# Patient Record
Sex: Female | Born: 1990 | Race: Black or African American | Hispanic: No | Marital: Married | Smoking: Never smoker
Health system: Southern US, Community
[De-identification: ages and names within clinical notes are randomized; demographics above are authoritative.]

## PROBLEM LIST (undated history)

## (undated) ENCOUNTER — Inpatient Hospital Stay (HOSPITAL_COMMUNITY): Payer: Self-pay

## (undated) DIAGNOSIS — Z789 Other specified health status: Secondary | ICD-10-CM

## (undated) HISTORY — PX: OTHER SURGICAL HISTORY: SHX169

---
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
## 2015-06-17 NOTE — L&D Delivery Note (Signed)
Delivery Note At 8:10 PM a viable female was delivered via  (Presentation:vertex ; LOA ).  APGAR: 8,9 ; weight  .   Placenta status:spont ,shultz.  Cord 3vc  with the following complications: none.  Cord pH: n/a  Anesthesia:  epidural Episiotomy: none  Lacerations:  none Suture Repair: n/a Est. Blood Loss 100(mL):    Mom to postpartum.  Baby to Couplet care / Skin to Skin.  Leah Morris 02/17/2016, 8:21 PM

## 2015-09-14 ENCOUNTER — Emergency Department (HOSPITAL_COMMUNITY): Payer: Medicaid Other

## 2015-09-14 ENCOUNTER — Encounter (HOSPITAL_COMMUNITY): Payer: Self-pay | Admitting: Family Medicine

## 2015-09-14 ENCOUNTER — Emergency Department (HOSPITAL_COMMUNITY)
Admission: EM | Admit: 2015-09-14 | Discharge: 2015-09-14 | Disposition: A | Discharge status: Home / Self Care | Payer: Medicaid Other | Attending: Emergency Medicine | Admitting: Emergency Medicine

## 2015-09-14 DIAGNOSIS — O23592 Infection of other part of genital tract in pregnancy, second trimester: Secondary | ICD-10-CM | POA: Diagnosis not present

## 2015-09-14 DIAGNOSIS — O209 Hemorrhage in early pregnancy, unspecified: Secondary | ICD-10-CM | POA: Diagnosis present

## 2015-09-14 DIAGNOSIS — Z3A15 15 weeks gestation of pregnancy: Secondary | ICD-10-CM | POA: Insufficient documentation

## 2015-09-14 DIAGNOSIS — B9689 Other specified bacterial agents as the cause of diseases classified elsewhere: Secondary | ICD-10-CM

## 2015-09-14 DIAGNOSIS — O200 Threatened abortion: Secondary | ICD-10-CM

## 2015-09-14 DIAGNOSIS — N939 Abnormal uterine and vaginal bleeding, unspecified: Secondary | ICD-10-CM

## 2015-09-14 DIAGNOSIS — N760 Acute vaginitis: Secondary | ICD-10-CM

## 2015-09-14 LAB — ABO/RH: ABO/RH(D): O POS

## 2015-09-14 LAB — WET PREP, GENITAL
Sperm: NONE SEEN
TRICH WET PREP: NONE SEEN
YEAST WET PREP: NONE SEEN

## 2015-09-14 LAB — I-STAT BETA HCG BLOOD, ED (MC, WL, AP ONLY): I-stat hCG, quantitative: >2000 m[IU]/mL — ABNORMAL HIGH (ref <5)

## 2015-09-14 LAB — RAPID HIV SCREEN (HIV 1/2 AB+AG)
HIV 1/2 Antibodies: NONREACTIVE
HIV-1 P24 ANTIGEN - HIV24: NONREACTIVE

## 2015-09-14 MED ORDER — METRONIDAZOLE 500 MG PO TABS: 500.0000 mg | ORAL_TABLET | Freq: Two times a day (BID) | ORAL | Status: DC

## 2015-09-14 NOTE — ED Notes (Signed)
Pt here sts she is [redacted] weeks pregnant and started bleeding today. sts she felt something come out. Denies pain. sts for the past few days before this she was spotting.

## 2015-09-14 NOTE — ED Provider Notes (Signed)
CSN: [id number]     Arrival date & time 09/14/15  1538 History  By signing my name below, I, Placido Sou, attest that this documentation has been prepared under the direction and in the presence of Fayrene Helper, PA-C. Electronically Signed: Placido Sou, ED Scribe. 09/14/2015. 4:56 PM.   Chief Complaint  Patient presents with  . Miscarriage   The history is provided by the patient. No language interpreter was used.    HPI Comments: Leah Morris is a 25 y.o. female who is [redacted] weeks pregnant and otherwise healthy presents to the Emergency Department complaining of worsening, mild, vaginal bleeding onset 3 days ago. Pt states she had an appointment 2 weeks ago where she confirmed being pregnant further noting that she was spotting at the time. She receieved an [place] during this visit which showed no abnormalities as well as another [place] 3 days ago with no abnormalities. Pt states she began spotting a few days ago that worsened to large amounts of vaginal bleeding s/p urinating earlier today. Her LNMP was in 03/2015. She reports a hx of 5 pregnancies, 1 miscarriage, 2 elective abortions at ~8 weeks, 1 molar pregnancy and 1 normal childbirth. She denies abd pain, recent trauma, lightheadedness or any other associated symptoms at this time.    History reviewed. No pertinent past medical history. History reviewed. No pertinent past surgical history. History reviewed. No pertinent family history. Social History  Substance Use Topics  . Smoking status: Never Smoker   . Smokeless tobacco: None  . Alcohol Use: No   OB History    Gravida Para Term Preterm AB TAB SAB Ectopic Multiple Living   1              Review of Systems  Gastrointestinal: Negative for abdominal pain.  Genitourinary: Positive for vaginal bleeding.  Neurological: Negative for light-headedness.  All other systems reviewed and are negative.  Allergies  Review of patient's allergies indicates no known allergies.  Home  Medications   Prior to Admission medications   Not on File   BP 119/62 mmHg  Pulse 108  Temp(Src) 98.6 F (37 C) (Oral)  Resp 12  Ht  (1.626 m)  Wt 164 lb 3 oz (74.475 kg)  BMI 28.17 kg/m2  SpO2 100%  LMP 04/13/2015 Physical Exam  Constitutional: She is oriented to person, place, and time. She appears well-developed and well-nourished.  HENT:  Head: Normocephalic and atraumatic.  Eyes: EOM are normal.  Neck: Normal range of motion.  Cardiovascular: Normal rate, regular rhythm and normal heart sounds.  Exam reveals no gallop and no friction rub.   No murmur heard. Pulmonary/Chest: Effort normal and breath sounds normal. No respiratory distress. She has no wheezes. She has no rales.  Abdominal: Soft. There is no tenderness. There is no CVA tenderness.  Gravid abd non-tender to palpation  Genitourinary:  Chaperone present during exam. No inguinal lymphadenopathy or inguinal hernia noted. Normal external genitalia. No significant discomfort with speculum insertion. Moderate amount of blood and products of conception noted in vaginal vault. Cervical os is closed. On bimanual examination, no adnexal tenderness or cervical motion tenderness.  Musculoskeletal: Normal range of motion.  Neurological: She is alert and oriented to person, place, and time.  Skin: Skin is warm and dry.  Psychiatric: She has a normal mood and affect.  Nursing note and vitals reviewed.  ED Course  Procedures  DIAGNOSTIC STUDIES: Oxygen Saturation is 100% on RA, normal by my interpretation.    COORDINATION OF  CARE: 4:52 PM Pt presents today due to vaginal bleeding. Discussed next steps with pt including a pelvic exam and lab work. Pt agreed to plan.  Labs Review Labs Reviewed  WET PREP, GENITAL - Abnormal; Notable for the following:    Clue Cells Wet Prep HPF POC PRESENT (*)    WBC, Wet Prep HPF POC MANY (*)    All other components within normal limits  I-STAT BETA HCG BLOOD, ED ([facility], [facility], [facility] ONLY)  - Abnormal; Notable for the following:    I-stat hCG, quantitative >2000.0 (*)    All other components within normal limits  RAPID HIV SCREEN (HIV 1/2 AB+AG)  RPR  ABO/RH  GC/CHLAMYDIA PROBE AMP ([place]) NOT AT [facility]    Imaging Review Koreas Ob Limited  09/14/2015  CLINICAL DATA:  Vaginal bleeding 2 hours. EXAM: LIMITED OBSTETRIC ULTRASOUND FINDINGS: Number of Fetuses:  Single Heart Rate:  155 bpm Movement: Yes Presentation: Transverse with head to maternal right side. Placental Location: Anterior Previa: No Amniotic Fluid (Subjective):  Within normal limits. BPD:  3.2cm 16w  0d MATERNAL FINDINGS: Cervix:  Appears closed. Uterus/Adnexae:  No abnormality visualized. IMPRESSION: Single live IUP with estimated gestational age [redacted] weeks 0 days. This exam is performed on an emergent basis and does not comprehensively evaluate fetal size, dating, or anatomy; follow-up complete OB US should be considered if further fetal assessment is warranted. Electronically Signed   By: Elberta Fortisaniel  Boyle M.D.   On: 09/14/2015 18:21   I have personally reviewed and evaluated these images and lab results as part of my medical decision-making.   EKG Interpretation None      MDM   Final diagnoses:  Threatened miscarriage  Bacterial vaginosis    BP 119/62 mmHg  Pulse 108  Temp(Src) 98.6 F (37 C) (Oral)  Resp 12  Ht 5\' 4"  (1.626 m)  Wt 74.475 kg  BMI 28.17 kg/m2  SpO2 100%  LMP 04/13/2015   I personally performed the services described in this documentation, which was scribed in my presence. The recorded information has been reviewed and is accurate.     5:23 PM Patient who is [redacted] weeks pregnant with recent full ultrasound indicating that it was an intrauterine pregnancy who is here with vaginal bleeding and presence of products of conception on pelvic exam concerning for incomplete abortion. Work up initiated, will repeat US.  Care discussed with Dr. Anitra LauthPlunkett  6:35 PM Wet prep shows evidence of clue  cells and many WBC. Her HIV test is nonreactive. She is O+. Her quantitative hCG is greater than 2000. A limited pelvic transvaginal ultrasound demonstrates a single live IUP with estimated gestational age of [redacted] weeks. It is recommended that patient will need a complete ultrasound within a week. I will provide treatment for bacterial vaginosis. Encouraged patient to continue with prenatal vitamins and avoid alcohol or tobacco use. [facility] Return precaution discussed. Patient voiced understanding and agrees with plan. Patient will be discharged with a diagnosis of threatened miscarriage.   Fayrene HelperBowie Vergia Chea, PA-C 09/14/15 1844  Gwyneth SproutWhitney Plunkett, MD 09/15/15 [phone]

## 2015-09-14 NOTE — Discharge Instructions (Signed)
Please follow up with [facility] next week for reevaluation of your vaginal bleeding.  Take metronidazole as treatment for your bacterial vaginosis.    Threatened Miscarriage A threatened miscarriage occurs when you have vaginal bleeding during your first 20 weeks of pregnancy but the pregnancy has not ended. If you have vaginal bleeding during this time, your health care provider will do tests to make sure you are still pregnant. If the tests show you are still pregnant and the developing baby (fetus) inside your womb (uterus) is still growing, your condition is considered a threatened miscarriage. A threatened miscarriage does not mean your pregnancy will end, but it does increase the risk of losing your pregnancy (complete miscarriage). CAUSES  The cause of a threatened miscarriage is usually not known. If you go on to have a complete miscarriage, the most common cause is an abnormal number of chromosomes in the developing baby. Chromosomes are the structures inside cells that hold all your genetic material. Some causes of vaginal bleeding that do not result in miscarriage include:  Having sex.  Having an infection.  Normal hormone changes of pregnancy.  Bleeding that occurs when an egg implants in your uterus. RISK FACTORS Risk factors for bleeding in early pregnancy include:  Obesity.  Smoking.  Drinking excessive amounts of alcohol or caffeine.  Recreational drug use. SIGNS AND SYMPTOMS  Light vaginal bleeding.  Mild abdominal pain or cramps. DIAGNOSIS  If you have bleeding with or without abdominal pain before 20 weeks of pregnancy, your health care provider will do tests to check whether you are still pregnant. One important test involves using sound waves and a computer (ultrasound) to create images of the inside of your uterus. Other tests include an internal exam of your vagina and uterus (pelvic exam) and measurement of your baby's heart rate.  You may be diagnosed  with a threatened miscarriage if:  Ultrasound testing shows you are still pregnant.  Your baby's heart rate is strong.  A pelvic exam shows that the opening between your uterus and your vagina (cervix) is closed.  Your heart rate and blood pressure are stable.  Blood tests confirm you are still pregnant. TREATMENT  No treatments have been shown to prevent a threatened miscarriage from going on to a complete miscarriage. However, the right home care is important.  HOME CARE INSTRUCTIONS   Make sure you keep all your appointments for prenatal care. This is very important.  Get plenty of rest.  Do not have sex or use tampons if you have vaginal bleeding.  Do not douche.  Do not smoke or use recreational drugs.  Do not drink alcohol.  Avoid caffeine. SEEK MEDICAL CARE IF:  You have light vaginal bleeding or spotting while pregnant.  You have abdominal pain or cramping.  You have a fever. SEEK IMMEDIATE MEDICAL CARE IF:  You have heavy vaginal bleeding.  You have blood clots coming from your vagina.  You have severe low back pain or abdominal cramps.  You have fever, chills, and severe abdominal pain. MAKE SURE YOU:  Understand these instructions.  Will watch your condition.  Will get help right away if you are not doing well or get worse.   This information is not intended to replace advice given to you by your health care provider. Make sure you discuss any questions you have with your health care provider.   Document Released: 06/02/2005 Document Revised: 06/07/2013 Document Reviewed: 03/29/2013 Elsevier Interactive Patient Education [employer]2016 Elsevier Inc.  Bacterial  Vaginosis Bacterial vaginosis is an infection of the vagina. It happens when too many germs (bacteria) grow in the vagina. Having this infection puts you at risk for getting other infections from sex. Treating this infection can help lower your risk for other infections, such as:    Chlamydia.  Gonorrhea.  HIV.  Herpes. HOME CARE  Take your medicine as told by your doctor.  Finish your medicine even if you start to feel better.  Tell your sex partner that you have an infection. They should see their doctor for treatment.  During treatment:  Avoid sex or use condoms correctly.  Do not douche.  Do not drink alcohol unless your doctor tells you it is ok.  Do not breastfeed unless your doctor tells you it is ok. GET HELP IF:  You are not getting better after 3 days of treatment.  You have more grey fluid (discharge) coming from your vagina than before.  You have more pain than before.  You have a fever. MAKE SURE YOU:   Understand these instructions.  Will watch your condition.  Will get help right away if you are not doing well or get worse.   This information is not intended to replace advice given to you by your health care provider. Make sure you discuss any questions you have with your health care provider.   Document Released: 03/11/2008 Document Revised: 06/23/2014 Document Reviewed: 01/12/2013 Elsevier Interactive Patient Education [employer]

## 2015-09-15 LAB — RPR: RPR Ser Ql: NONREACTIVE

## 2015-09-17 ENCOUNTER — Telehealth: Payer: Self-pay | Admitting: Family Medicine

## 2015-09-17 LAB — GC/CHLAMYDIA PROBE AMP (~~LOC~~) NOT AT ARMC
Chlamydia: NEGATIVE
Neisseria Gonorrhea: NEGATIVE

## 2015-09-17 NOTE — Telephone Encounter (Signed)
Patient called into clinic, due to continued bleeding and miscarriage concerns. Talked to Dr. Alvester MorinNewton who [facility] and to follow up after in clinic.

## 2015-09-21 ENCOUNTER — Ambulatory Visit: Payer: Self-pay | Admitting: Obstetrics and Gynecology

## 2015-09-25 NOTE — Telephone Encounter (Signed)
According to chart patient has upcoming appt on 10/04/2015 will discuss at that office visit.

## 2015-10-04 ENCOUNTER — Encounter: Payer: Self-pay | Admitting: Obstetrics & Gynecology

## 2015-10-04 ENCOUNTER — Ambulatory Visit (INDEPENDENT_AMBULATORY_CARE_PROVIDER_SITE_OTHER): Payer: Self-pay | Admitting: Obstetrics & Gynecology

## 2015-10-04 VITALS — Wt 154.6 lb

## 2015-10-04 DIAGNOSIS — Z3490 Encounter for supervision of normal pregnancy, unspecified, unspecified trimester: Secondary | ICD-10-CM | POA: Insufficient documentation

## 2015-10-04 DIAGNOSIS — O4692 Antepartum hemorrhage, unspecified, second trimester: Secondary | ICD-10-CM

## 2015-10-04 DIAGNOSIS — Z3492 Encounter for supervision of normal pregnancy, unspecified, second trimester: Secondary | ICD-10-CM

## 2015-10-04 DIAGNOSIS — Z3689 Encounter for other specified antenatal screening: Secondary | ICD-10-CM

## 2015-10-04 DIAGNOSIS — Z124 Encounter for screening for malignant neoplasm of cervix: Secondary | ICD-10-CM

## 2015-10-04 LAB — POCT URINALYSIS DIP (DEVICE)
GLUCOSE, UA: NEGATIVE mg/dL
Ketones, ur: 40 mg/dL — AB
Leukocytes, UA: NEGATIVE
NITRITE: NEGATIVE
PROTEIN: 30 mg/dL — AB
Specific Gravity, Urine: 1.025 (ref 1.005–1.030)
UROBILINOGEN UA: 4 mg/dL — AB (ref 0.0–1.0)
pH: 7 (ref 5.0–8.0)

## 2015-10-04 NOTE — Progress Notes (Signed)
   Subjective: vaginal bleeding for 3 weeks, now dark spotting    Leah Morris is a [id number] 7514w6d being seen today for her first obstetrical visit.  Her obstetrical history is significant for second trimester bleeding. Patient does intend to breast feed. Pregnancy history fully reviewed.  Patient reports dark blood.  Filed Vitals:   10/04/15 1422  Weight: 154 lb 9.6 oz (70.126 kg)    HISTORY: OB History  Gravida Para Term Preterm AB SAB TAB Ectopic Multiple Living  5 1 1  3 2 1   1     # Outcome Date GA Lbr Len/2nd Weight Sex Delivery Anes PTL Lv  5 Current           4 Term 07/03/14 527w5d  6 lb 7 oz (2.92 kg) F Vag-Spont EPI  Y  3 SAB 2010             Complications: Molar pregnancy  2 SAB 2008          1 TAB 2006             History reviewed. No pertinent past medical history. Past Surgical History  Procedure Laterality Date  . Dilation and curettage of uterus  2010   History reviewed. No pertinent family history.   Exam    Uterus:     Pelvic Exam:    Perineum: No Hemorrhoids   Vulva: normal   Vagina:  scant blood   pH:    Cervix: no lesions   Adnexa: not evaluated   Bony Pelvis: average  System: Breast:  normal appearance, no masses or tenderness   Skin: normal coloration and turgor, no rashes    Neurologic: oriented, normal mood   Extremities: normal strength, tone, and muscle mass   HEENT oropharynx clear, no lesions and thyroid without masses   Mouth/Teeth mucous membranes moist, pharynx normal without lesions and dental hygiene good   Neck supple   Cardiovascular: regular rate and rhythm   Respiratory:  appears well, vitals normal, no respiratory distress, acyanotic, normal RR   Abdomen: soft, non-tender; bowel sounds normal; no masses,  no organomegaly   Urinary: urethral meatus normal      Assessment:    Pregnancy: [id number] Patient Active Problem List   Diagnosis Date Noted  . Supervision of low-risk pregnancy 10/04/2015  . Second trimester  bleeding 10/04/2015        Plan:     Initial labs drawn. Prenatal vitamins. Problem list reviewed and updated. Genetic Screening discussed Quad Screen: next visit.  Ultrasound discussed; fetal survey: ordered.  Follow up in 2 weeks. 50% of 30 min visit spent on counseling and coordination of care.  Bleeding precautions   ARNOLD,JAMES 10/04/2015

## 2015-10-04 NOTE — Patient Instructions (Signed)

## 2015-10-04 NOTE — Progress Notes (Signed)
New ob packet given  Anatomy US scheduled for April 24th @ 1500.   Pt notified

## 2015-10-05 LAB — PRENATAL PROFILE (SOLSTAS)
ANTIBODY SCREEN: NEGATIVE
BASOS ABS: 0 {cells}/uL (ref 0–200)
BASOS PCT: 0 %
EOS PCT: 1 %
Eosinophils Absolute: 73 cells/uL (ref 15–500)
HCT: 35.3 % (ref 35.0–45.0)
HEMOGLOBIN: 11.8 g/dL (ref 11.7–15.5)
HEP B S AG: NEGATIVE
HIV 1&2 Ab, 4th Generation: NONREACTIVE
LYMPHS ABS: 1533 {cells}/uL (ref 850–3900)
Lymphocytes Relative: 21 %
MCH: 31.2 pg (ref 27.0–33.0)
MCHC: 33.4 g/dL (ref 32.0–36.0)
MCV: 93.4 fL (ref 80.0–100.0)
MONOS PCT: 12 %
MPV: 9.8 fL (ref 7.5–12.5)
Monocytes Absolute: 876 cells/uL (ref 200–950)
NEUTROS ABS: 4818 {cells}/uL (ref 1500–7800)
Neutrophils Relative %: 66 %
PLATELETS: 228 10*3/uL (ref 140–400)
RBC: 3.78 MIL/uL — AB (ref 3.80–5.10)
RDW: 14.7 % (ref 11.0–15.0)
RH TYPE: POSITIVE
RUBELLA: 3.55 {index} — AB (ref <0.90)
WBC: 7.3 10*3/uL (ref 3.8–10.8)

## 2015-10-08 ENCOUNTER — Other Ambulatory Visit: Payer: Self-pay | Admitting: Obstetrics & Gynecology

## 2015-10-08 ENCOUNTER — Ambulatory Visit (HOSPITAL_COMMUNITY)
Admission: RE | Admit: 2015-10-08 | Discharge: 2015-10-08 | Disposition: A | Discharge status: Home / Self Care | Payer: Medicaid Other | Source: Ambulatory Visit | Attending: Obstetrics & Gynecology | Admitting: Obstetrics & Gynecology

## 2015-10-08 DIAGNOSIS — O4692 Antepartum hemorrhage, unspecified, second trimester: Secondary | ICD-10-CM | POA: Diagnosis not present

## 2015-10-08 DIAGNOSIS — Z3A18 18 weeks gestation of pregnancy: Secondary | ICD-10-CM | POA: Insufficient documentation

## 2015-10-08 DIAGNOSIS — Z36 Encounter for antenatal screening of mother: Secondary | ICD-10-CM | POA: Diagnosis not present

## 2015-10-08 DIAGNOSIS — Z3A19 19 weeks gestation of pregnancy: Secondary | ICD-10-CM

## 2015-10-08 DIAGNOSIS — Z3492 Encounter for supervision of normal pregnancy, unspecified, second trimester: Secondary | ICD-10-CM

## 2015-10-08 DIAGNOSIS — Z3689 Encounter for other specified antenatal screening: Secondary | ICD-10-CM

## 2015-10-08 LAB — HEMOGLOBINOPATHY EVALUATION
HEMOGLOBIN OTHER: 0 %
HGB A: 97.2 % (ref 96.8–97.8)
HGB F QUANT: 0 % (ref 0.0–2.0)
HGB S QUANTITAION: 0 %
Hgb A2 Quant: 2.8 % (ref 2.2–3.2)

## 2015-10-08 LAB — AFP, QUAD SCREEN
AFP: 53.5 ng/mL
CURR GEST AGE: 18.9 wk
Down Syndrome Scr Risk Est: 1:38500 {titer}
HCG, Total: 9.96 IU/mL
INH: 166.7 pg/mL
Interpretation-AFP: NEGATIVE
MOM FOR AFP: 0.98
MOM FOR INH: 0.99
MoM for hCG: 0.39
OPEN SPINA BIFIDA: NEGATIVE
Osb Risk: 1:28200 {titer}
Tri 18 Scr Risk Est: NEGATIVE
Trisomy 18 (Edward) Syndrome Interp.: 1:3220 {titer}
UE3 MOM: 0.76
UE3 VALUE: 1.14 ng/mL

## 2015-10-09 ENCOUNTER — Other Ambulatory Visit (HOSPITAL_COMMUNITY): Payer: Self-pay | Admitting: *Deleted

## 2015-10-09 DIAGNOSIS — O418X90 Other specified disorders of amniotic fluid and membranes, unspecified trimester, not applicable or unspecified: Secondary | ICD-10-CM

## 2015-10-09 DIAGNOSIS — O4590 Premature separation of placenta, unspecified, unspecified trimester: Principal | ICD-10-CM

## 2015-10-18 ENCOUNTER — Encounter (HOSPITAL_COMMUNITY): Payer: Self-pay

## 2015-10-18 ENCOUNTER — Ambulatory Visit (HOSPITAL_COMMUNITY)
Admission: RE | Admit: 2015-10-18 | Discharge: 2015-10-18 | Disposition: A | Discharge status: Home / Self Care | Payer: Medicaid Other | Source: Ambulatory Visit | Attending: Obstetrics & Gynecology | Admitting: Obstetrics & Gynecology

## 2015-10-18 ENCOUNTER — Other Ambulatory Visit (HOSPITAL_COMMUNITY): Payer: Self-pay | Admitting: Maternal and Fetal Medicine

## 2015-10-18 VITALS — BP 116/56 | HR 79 | Wt 161.0 lb

## 2015-10-18 DIAGNOSIS — O4592 Premature separation of placenta, unspecified, second trimester: Secondary | ICD-10-CM | POA: Diagnosis not present

## 2015-10-18 DIAGNOSIS — O4590 Premature separation of placenta, unspecified, unspecified trimester: Secondary | ICD-10-CM

## 2015-10-18 DIAGNOSIS — O358XX0 Maternal care for other (suspected) fetal abnormality and damage, not applicable or unspecified: Secondary | ICD-10-CM | POA: Diagnosis present

## 2015-10-18 DIAGNOSIS — Z3A20 20 weeks gestation of pregnancy: Secondary | ICD-10-CM

## 2015-10-18 DIAGNOSIS — O359XX0 Maternal care for (suspected) fetal abnormality and damage, unspecified, not applicable or unspecified: Secondary | ICD-10-CM

## 2015-10-18 DIAGNOSIS — O418X90 Other specified disorders of amniotic fluid and membranes, unspecified trimester, not applicable or unspecified: Secondary | ICD-10-CM

## 2015-10-18 NOTE — ED Notes (Signed)
Pt continues to have small amt of brownish discharge.

## 2015-10-19 ENCOUNTER — Ambulatory Visit (INDEPENDENT_AMBULATORY_CARE_PROVIDER_SITE_OTHER): Payer: Medicaid Other | Admitting: Family Medicine

## 2015-10-19 VITALS — BP 118/63 | HR 76 | Wt 161.4 lb

## 2015-10-19 DIAGNOSIS — O9989 Other specified diseases and conditions complicating pregnancy, childbirth and the puerperium: Secondary | ICD-10-CM

## 2015-10-19 DIAGNOSIS — Z3492 Encounter for supervision of normal pregnancy, unspecified, second trimester: Secondary | ICD-10-CM

## 2015-10-19 DIAGNOSIS — N898 Other specified noninflammatory disorders of vagina: Secondary | ICD-10-CM

## 2015-10-19 LAB — POCT URINALYSIS DIP (DEVICE)
BILIRUBIN URINE: NEGATIVE
Glucose, UA: NEGATIVE mg/dL
KETONES UR: NEGATIVE mg/dL
Nitrite: NEGATIVE
PH: 7 (ref 5.0–8.0)
Protein, ur: 30 mg/dL — AB
Specific Gravity, Urine: 1.02 (ref 1.005–1.030)
Urobilinogen, UA: 1 mg/dL (ref 0.0–1.0)

## 2015-10-19 NOTE — Progress Notes (Signed)
Subjective:  Leah Morris is a 25 y.o. G5P1031 at 7542w1d being seen today for ongoing prenatal care.  She is currently monitored for the following issues for this low-risk pregnancy and has Supervision of low-risk pregnancy and Second trimester bleeding on her problem list.  Patient reports vaginal irritation.  Had BV, not treated.  Contractions: Not present. Vag. Bleeding: Scant.  Movement: Present. Denies leaking of fluid.   The following portions of the patient's history were reviewed and updated as appropriate: allergies, current medications, past family history, past medical history, past social history, past surgical history and problem list. Problem list updated.  Objective:   Filed Vitals:   10/19/15 1103  BP: 118/63  Pulse: 76  Weight: 161 lb 6.4 oz (73.211 kg)    Fetal Status: Fetal Heart Rate (bpm): 150   Movement: Present     General:  Alert, oriented and cooperative. Patient is in no acute distress.  Skin: Skin is warm and dry. No rash noted.   Cardiovascular: Normal heart rate noted  Respiratory: Normal respiratory effort, no problems with respiration noted  Abdomen: Soft, gravid, appropriate for gestational age. Pain/Pressure: Present     Pelvic: Vag. Bleeding: Scant     Cervical exam deferred        Extremities: Normal range of motion.  Edema: None  Mental Status: Normal mood and affect. Normal behavior. Normal judgment and thought content.   Urinalysis: Urine Protein: 1+ Urine Glucose: Negative  Assessment and Plan:  Pregnancy: G5P1031 at 9742w1d  1. Supervision of low-risk pregnancy, second trimester FHT, FH  2.  Vaginal irritation Wet prep today.  Preterm labor symptoms and general obstetric precautions including but not limited to vaginal bleeding, contractions, leaking of fluid and fetal movement were reviewed in detail with the patient. Please refer to After Visit Summary for other counseling recommendations.  No Follow-up on file.   Levie HeritageJacob J Stinson,  DO

## 2015-10-19 NOTE — Progress Notes (Signed)
Patient having brown discharge an some odor

## 2015-10-20 LAB — WET PREP, GENITAL: Trich, Wet Prep: NONE SEEN

## 2015-10-21 ENCOUNTER — Other Ambulatory Visit: Payer: Self-pay | Admitting: Family Medicine

## 2015-10-21 MED ORDER — METRONIDAZOLE 500 MG PO TABS: 2000.0000 mg | ORAL_TABLET | Freq: Once | ORAL | Status: DC

## 2015-10-21 MED ORDER — FLUCONAZOLE 150 MG PO TABS: 150.0000 mg | ORAL_TABLET | Freq: Once | ORAL | Status: DC

## 2015-11-01 ENCOUNTER — Ambulatory Visit (HOSPITAL_COMMUNITY): Admission: RE | Admit: 2015-11-01 | Payer: Medicaid Other | Source: Ambulatory Visit

## 2015-11-13 ENCOUNTER — Encounter: Payer: Medicaid Other | Admitting: Family

## 2015-11-16 ENCOUNTER — Encounter (HOSPITAL_COMMUNITY): Payer: Self-pay

## 2015-11-16 ENCOUNTER — Ambulatory Visit (HOSPITAL_COMMUNITY)
Admission: RE | Admit: 2015-11-16 | Discharge: 2015-11-16 | Disposition: A | Discharge status: Home / Self Care | Payer: Medicaid Other | Source: Ambulatory Visit | Attending: Obstetrics & Gynecology | Admitting: Obstetrics & Gynecology

## 2015-11-16 ENCOUNTER — Other Ambulatory Visit (HOSPITAL_COMMUNITY): Payer: Self-pay | Admitting: Maternal and Fetal Medicine

## 2015-11-16 DIAGNOSIS — O418X20 Other specified disorders of amniotic fluid and membranes, second trimester, not applicable or unspecified: Secondary | ICD-10-CM

## 2015-11-16 DIAGNOSIS — Z3A24 24 weeks gestation of pregnancy: Secondary | ICD-10-CM | POA: Diagnosis not present

## 2015-11-16 DIAGNOSIS — O283 Abnormal ultrasonic finding on antenatal screening of mother: Secondary | ICD-10-CM

## 2015-11-16 DIAGNOSIS — O4592 Premature separation of placenta, unspecified, second trimester: Secondary | ICD-10-CM | POA: Insufficient documentation

## 2015-11-16 DIAGNOSIS — O359XX0 Maternal care for (suspected) fetal abnormality and damage, unspecified, not applicable or unspecified: Secondary | ICD-10-CM | POA: Diagnosis not present

## 2015-11-16 DIAGNOSIS — O468X2 Other antepartum hemorrhage, second trimester: Secondary | ICD-10-CM

## 2015-11-16 HISTORY — DX: Other specified health status: Z78.9

## 2015-11-30 ENCOUNTER — Ambulatory Visit (HOSPITAL_COMMUNITY): Admission: RE | Admit: 2015-11-30 | Payer: Medicaid Other | Source: Ambulatory Visit

## 2015-12-03 ENCOUNTER — Ambulatory Visit (INDEPENDENT_AMBULATORY_CARE_PROVIDER_SITE_OTHER): Payer: Medicaid Other | Admitting: Medical

## 2015-12-03 VITALS — BP 116/62 | HR 74 | Wt 167.6 lb

## 2015-12-03 DIAGNOSIS — Z3492 Encounter for supervision of normal pregnancy, unspecified, second trimester: Secondary | ICD-10-CM

## 2015-12-03 DIAGNOSIS — Z23 Encounter for immunization: Secondary | ICD-10-CM | POA: Diagnosis not present

## 2015-12-03 LAB — CBC
HCT: 35.6 % (ref 35.0–45.0)
Hemoglobin: 12.1 g/dL (ref 11.7–15.5)
MCH: 31.7 pg (ref 27.0–33.0)
MCHC: 34 g/dL (ref 32.0–36.0)
MCV: 93.2 fL (ref 80.0–100.0)
MPV: 10.5 fL (ref 7.5–12.5)
PLATELETS: 234 10*3/uL (ref 140–400)
RBC: 3.82 MIL/uL (ref 3.80–5.10)
RDW: 13.7 % (ref 11.0–15.0)
WBC: 10.1 10*3/uL (ref 3.8–10.8)

## 2015-12-03 LAB — POCT URINALYSIS DIP (DEVICE)
BILIRUBIN URINE: NEGATIVE
GLUCOSE, UA: NEGATIVE mg/dL
HGB URINE DIPSTICK: NEGATIVE
Ketones, ur: NEGATIVE mg/dL
LEUKOCYTES UA: NEGATIVE
NITRITE: NEGATIVE
Protein, ur: NEGATIVE mg/dL
Specific Gravity, Urine: 1.015 (ref 1.005–1.030)
UROBILINOGEN UA: 0.2 mg/dL (ref 0.0–1.0)
pH: 7 (ref 5.0–8.0)

## 2015-12-03 MED ORDER — TETANUS-DIPHTH-ACELL PERTUSSIS 5-2.5-18.5 LF-MCG/0.5 IM SUSP
0.5000 mL | Freq: Once | INTRAMUSCULAR | Status: AC
  Administered 2015-12-03: 0.5 mL via INTRAMUSCULAR

## 2015-12-03 NOTE — Progress Notes (Signed)
28 wk labs today 28 wk packet given  

## 2015-12-03 NOTE — Progress Notes (Signed)
Subjective:  Leah Morris is a 25 y.o. G5P1031 at 2341w4d being seen today for ongoing prenatal care.  She is currently monitored for the following issues for this low-risk pregnancy and has Supervision of low-risk pregnancy and Second trimester bleeding on her problem list.  Patient reports no complaints.  Contractions: Not present. Vag. Bleeding: None.  Movement: Present. Denies leaking of fluid.   The following portions of the patient's history were reviewed and updated as appropriate: allergies, current medications, past family history, past medical history, past social history, past surgical history and problem list. Problem list updated.  Objective:   Filed Vitals:   12/03/15 1437  BP: 116/62  Pulse: 74  Weight: 167 lb 9.6 oz (76.023 kg)    Fetal Status: Fetal Heart Rate (bpm): 140 Fundal Height: 26 cm Movement: Present     General:  Alert, oriented and cooperative. Patient is in no acute distress.  Skin: Skin is warm and dry. No rash noted.   Cardiovascular: Normal heart rate noted  Respiratory: Normal respiratory effort, no problems with respiration noted  Abdomen: Soft, gravid, appropriate for gestational age. Pain/Pressure: Present     Pelvic: Cervical exam deferred        Extremities: Normal range of motion.  Edema: None  Mental Status: Normal mood and affect. Normal behavior. Normal judgment and thought content.   Urinalysis: Urine Protein: Negative Urine Glucose: Negative  Assessment and Plan:  Pregnancy: G5P1031 at 4341w4d  1. Supervision of low-risk pregnancy, second trimester - Glucose Tolerance, 1 HR (50g) w/o Fasting - HIV antibody (with reflex) - RPR - CBC - Records from last OB in [place] requested for last pap smear results  Preterm labor symptoms and general obstetric precautions including but not limited to vaginal bleeding, contractions, leaking of fluid and fetal movement were reviewed in detail with the patient. Please refer to After Visit Summary for  other counseling recommendations.  Return in about 2 weeks (around 12/17/2015) for LOB.   Marny LowensteinJulie N Wenzel, PA-C

## 2015-12-03 NOTE — Patient Instructions (Signed)
Second Trimester of Pregnancy  The second trimester is from week 13 through week 28, month 4 through 6. This is often the time in pregnancy that you feel your best. Often times, morning sickness has lessened or quit. You may have more energy, and you may get hungry more often. Your unborn baby (fetus) is growing rapidly. At the end of the sixth month, he or she is about 9 inches long and weighs about 1½ pounds. You will likely feel the baby move (quickening) between 18 and 20 weeks of pregnancy.  HOME CARE   · Avoid all smoking, herbs, and alcohol. Avoid drugs not approved by your doctor.  · Do not use any tobacco products, including cigarettes, chewing tobacco, and electronic cigarettes. If you need help quitting, ask your doctor. You may get counseling or other support to help you quit.  · Only take medicine as told by your doctor. Some medicines are safe and some are not during pregnancy.  · Exercise only as told by your doctor. Stop exercising if you start having cramps.  · Eat regular, healthy meals.  · Wear a good support bra if your breasts are tender.  · Do not use hot tubs, steam rooms, or saunas.  · Wear your seat belt when driving.  · Avoid raw meat, uncooked cheese, and liter boxes and soil used by cats.  · Take your prenatal vitamins.  · Take 1500-2000 milligrams of calcium daily starting at the 20th week of pregnancy until you deliver your baby.  · Try taking medicine that helps you poop (stool softener) as needed, and if your doctor approves. Eat more fiber by eating fresh fruit, vegetables, and whole grains. Drink enough fluids to keep your pee (urine) clear or pale yellow.  · Take warm water baths (sitz baths) to soothe pain or discomfort caused by hemorrhoids. Use hemorrhoid cream if your doctor approves.  · If you have puffy, bulging veins (varicose veins), wear support hose. Raise (elevate) your feet for 15 minutes, 3-4 times a day. Limit salt in your diet.  · Avoid heavy lifting, wear low heals,  and sit up straight.  · Rest with your legs raised if you have leg cramps or low back pain.  · Visit your dentist if you have not gone during your pregnancy. Use a soft toothbrush to brush your teeth. Be gentle when you floss.  · You can have sex (intercourse) unless your doctor tells you not to.  · Go to your doctor visits.  GET HELP IF:   · You feel dizzy.  · You have mild cramps or pressure in your lower belly (abdomen).  · You have a nagging pain in your belly area.  · You continue to feel sick to your stomach (nauseous), throw up (vomit), or have watery poop (diarrhea).  · You have bad smelling fluid coming from your vagina.  · You have pain with peeing (urination).  GET HELP RIGHT AWAY IF:   · You have a fever.  · You are leaking fluid from your vagina.  · You have spotting or bleeding from your vagina.  · You have severe belly cramping or pain.  · You lose or gain weight rapidly.  · You have trouble catching your breath and have chest pain.  · You notice sudden or extreme puffiness (swelling) of your face, hands, ankles, feet, or legs.  · You have not felt the baby move in over an hour.  · You have severe headaches that do   not go away with medicine.  · You have vision changes.     This information is not intended to replace advice given to you by your health care provider. Make sure you discuss any questions you have with your health care provider.     Document Released: 08/27/2009 Document Revised: 06/23/2014 Document Reviewed: 08/03/2012  Elsevier Interactive Patient Education ©2016 Elsevier Inc.

## 2015-12-04 LAB — RPR

## 2015-12-04 LAB — HIV ANTIBODY (ROUTINE TESTING W REFLEX): HIV 1&2 Ab, 4th Generation: NONREACTIVE

## 2015-12-04 LAB — GLUCOSE TOLERANCE, 1 HOUR (50G) W/O FASTING: Glucose, 1 Hr, gestational: 77 mg/dL (ref <140)

## 2015-12-14 ENCOUNTER — Ambulatory Visit (HOSPITAL_COMMUNITY): Payer: Medicaid Other

## 2015-12-19 ENCOUNTER — Ambulatory Visit (INDEPENDENT_AMBULATORY_CARE_PROVIDER_SITE_OTHER): Payer: Self-pay | Admitting: Obstetrics and Gynecology

## 2015-12-19 VITALS — BP 116/63 | HR 74 | Wt 174.0 lb

## 2015-12-19 DIAGNOSIS — Z3493 Encounter for supervision of normal pregnancy, unspecified, third trimester: Secondary | ICD-10-CM

## 2015-12-19 LAB — POCT URINALYSIS DIP (DEVICE)
Bilirubin Urine: NEGATIVE
Glucose, UA: NEGATIVE mg/dL
HGB URINE DIPSTICK: NEGATIVE
Ketones, ur: NEGATIVE mg/dL
NITRITE: NEGATIVE
Protein, ur: 30 mg/dL — AB
Specific Gravity, Urine: 1.02 (ref 1.005–1.030)
UROBILINOGEN UA: 0.2 mg/dL (ref 0.0–1.0)
pH: 7 (ref 5.0–8.0)

## 2015-12-19 MED ORDER — CONCEPT OB 130-92.4-1 MG PO CAPS: 1.0000 | ORAL_CAPSULE | Freq: Every day | ORAL | Status: DC

## 2015-12-19 NOTE — Progress Notes (Signed)
SUBJECTIVE: Leah Morris is a 25 y.o. G5P1031 at 272w6d being seen for ongoing PN care. Pregnancy is low-risk. Per pt, she was induced at 39 wks with prior delivery d/t suspected small baby but birth weight was 6#7. No concerns except undecided on contraceptive method. No UCs., vaginal bleeding, leakage of fluid. Good FM. Reviewed history, allergies, meds, PMH, PL.  OBJECTIVE: Filed Vitals:   12/19/15 0758  BP: 116/63  Pulse: 74  Weight: 174 lb (78.926 kg)  SVE deferred FHR 150  and FH 28cm UA: pro1+ ASSESSMENT and PLAN: Supervision of low-risk pregnancy, third trimester - Plan: POCT urinalysis dip (device), Prenat w/o A Vit-FeFum-FePo-FA (CONCEPT OB) 130-92.4-1 MG CAPS Refer to AVS Return in 2 wks. Still awaiting Pap result from [place].

## 2015-12-19 NOTE — Progress Notes (Signed)
Breastfeeding tip reviewed 

## 2015-12-19 NOTE — Patient Instructions (Addendum)
Third Trimester of Pregnancy The third trimester is from week 29 through week 42, months 7 through 9. The third trimester is a time when the fetus is growing rapidly. At the end of the ninth month, the fetus is about 20 inches in length and weighs 6-10 pounds.  BODY CHANGES Your body goes through many changes during pregnancy. The changes vary from woman to woman.   Your weight will continue to increase. You can expect to gain 25-35 pounds (11-16 kg) by the end of the pregnancy.  You may begin to get stretch marks on your hips, abdomen, and breasts.  You may urinate more often because the fetus is moving lower into your pelvis and pressing on your bladder.  You may develop or continue to have heartburn as a result of your pregnancy.  You may develop constipation because certain hormones are causing the muscles that push waste through your intestines to slow down.  You may develop hemorrhoids or swollen, bulging veins (varicose veins).  You may have pelvic pain because of the weight gain and pregnancy hormones relaxing your joints between the bones in your pelvis. Backaches may result from overexertion of the muscles supporting your posture.  You may have changes in your hair. These can include thickening of your hair, rapid growth, and changes in texture. Some women also have hair loss during or after pregnancy, or hair that feels dry or thin. Your hair will most likely return to normal after your baby is born.  Your breasts will continue to grow and be tender. A yellow discharge may leak from your breasts called colostrum.  Your belly button may stick out.  You may feel short of breath because of your expanding uterus.  You may notice the fetus "dropping," or moving lower in your abdomen.  You may have a bloody mucus discharge. This usually occurs a few days to a week before labor begins.  Your cervix becomes thin and soft (effaced) near your due date. WHAT TO EXPECT AT YOUR PRENATAL  EXAMS  You will have prenatal exams every 2 weeks until week 36. Then, you will have weekly prenatal exams. During a routine prenatal visit:  You will be weighed to make sure you and the fetus are growing normally.  Your blood pressure is taken.  Your abdomen will be measured to track your baby's growth.  The fetal heartbeat will be listened to.  Any test results from the previous visit will be discussed.  You may have a cervical check near your due date to see if you have effaced. At around 36 weeks, your caregiver will check your cervix. At the same time, your caregiver will also perform a test on the secretions of the vaginal tissue. This test is to determine if a type of bacteria, Group B streptococcus, is present. Your caregiver will explain this further. Your caregiver may ask you:  What your birth plan is.  How you are feeling.  If you are feeling the baby move.  If you have had any abnormal symptoms, such as leaking fluid, bleeding, severe headaches, or abdominal cramping.  If you are using any tobacco products, including cigarettes, chewing tobacco, and electronic cigarettes.  If you have any questions. Other tests or screenings that may be performed during your third trimester include:  Blood tests that check for low iron levels (anemia).  Fetal testing to check the health, activity level, and growth of the fetus. Testing is done if you have certain medical conditions or if   there are problems during the pregnancy.  HIV (human immunodeficiency virus) testing. If you are at high risk, you may be screened for HIV during your third trimester of pregnancy. FALSE LABOR You may feel small, irregular contractions that eventually go away. These are called Braxton Hicks contractions, or false labor. Contractions may last for hours, days, or even weeks before true labor sets in. If contractions come at regular intervals, intensify, or become painful, it is best to be seen by your  caregiver.  SIGNS OF LABOR   Menstrual-like cramps.  Contractions that are 5 minutes apart or less.  Contractions that start on the top of the uterus and spread down to the lower abdomen and back.  A sense of increased pelvic pressure or back pain.  A watery or bloody mucus discharge that comes from the vagina. If you have any of these signs before the 37th week of pregnancy, call your caregiver right away. You need to go to the hospital to get checked immediately. HOME CARE INSTRUCTIONS   Avoid all smoking, herbs, alcohol, and unprescribed drugs. These chemicals affect the formation and growth of the baby.  Do not use any tobacco products, including cigarettes, chewing tobacco, and electronic cigarettes. If you need help quitting, ask your health care provider. You may receive counseling support and other resources to help you quit.  Follow your caregiver's instructions regarding medicine use. There are medicines that are either safe or unsafe to take during pregnancy.  Exercise only as directed by your caregiver. Experiencing uterine cramps is a good sign to stop exercising.  Continue to eat regular, healthy meals.  Wear a good support bra for breast tenderness.  Do not use hot tubs, steam rooms, or saunas.  Wear your seat belt at all times when driving.  Avoid raw meat, uncooked cheese, cat litter boxes, and soil used by cats. These carry germs that can cause birth defects in the baby.  Take your prenatal vitamins.  Take 1500-2000 mg of calcium daily starting at the 20th week of pregnancy until you deliver your baby.  Try taking a stool softener (if your caregiver approves) if you develop constipation. Eat more high-fiber foods, such as fresh vegetables or fruit and whole grains. Drink plenty of fluids to keep your urine clear or pale yellow.  Take warm sitz baths to soothe any pain or discomfort caused by hemorrhoids. Use hemorrhoid cream if your caregiver approves.  If  you develop varicose veins, wear support hose. Elevate your feet for 15 minutes, 3-4 times a day. Limit salt in your diet.  Avoid heavy lifting, wear low heal shoes, and practice good posture.  Rest a lot with your legs elevated if you have leg cramps or low back pain.  Visit your dentist if you have not gone during your pregnancy. Use a soft toothbrush to brush your teeth and be gentle when you floss.  A sexual relationship may be continued unless your caregiver directs you otherwise.  Do not travel far distances unless it is absolutely necessary and only with the approval of your caregiver.  Take prenatal classes to understand, practice, and ask questions about the labor and delivery.  Make a trial run to the hospital.  Pack your hospital bag.  Prepare the baby's nursery.  Continue to go to all your prenatal visits as directed by your caregiver. SEEK MEDICAL CARE IF:  You are unsure if you are in labor or if your water has broken.  You have dizziness.  You have   mild pelvic cramps, pelvic pressure, or nagging pain in your abdominal area.  You have persistent nausea, vomiting, or diarrhea.  You have a bad smelling vaginal discharge.  You have pain with urination. SEEK IMMEDIATE MEDICAL CARE IF:   You have a fever.  You are leaking fluid from your vagina.  You have spotting or bleeding from your vagina.  You have severe abdominal cramping or pain.  You have rapid weight loss or gain.  You have shortness of breath with chest pain.  You notice sudden or extreme swelling of your face, hands, ankles, feet, or legs.  You have not felt your baby move in over an hour.  You have severe headaches that do not go away with medicine.  You have vision changes.   This information is not intended to replace advice given to you by your health care provider. Make sure you discuss any questions you have with your health care provider.   Document Released: 05/27/2001 Document  Revised: 06/23/2014 Document Reviewed: 08/03/2012 Elsevier Interactive Patient Education 2016 [employer] Contraception Choices Contraception (birth control) is the use of any methods or devices to prevent pregnancy. Below are some methods to help avoid pregnancy. HORMONAL METHODS   Contraceptive implant. This is a thin, plastic tube containing progesterone hormone. It does not contain estrogen hormone. Your health care provider inserts the tube in the inner part of the upper arm. The tube can remain in place for up to 3 years. After 3 years, the implant must be removed. The implant prevents the ovaries from releasing an egg (ovulation), thickens the cervical mucus to prevent sperm from entering the uterus, and thins the lining of the inside of the uterus.  Progesterone-only injections. These injections are given every 3 months by your health care provider to prevent pregnancy. This synthetic progesterone hormone stops the ovaries from releasing eggs. It also thickens cervical mucus and changes the uterine lining. This makes it harder for sperm to survive in the uterus.  Birth control pills. These pills contain estrogen and progesterone hormone. They work by preventing the ovaries from releasing eggs (ovulation). They also cause the cervical mucus to thicken, preventing the sperm from entering the uterus. Birth control pills are prescribed by a health care provider.Birth control pills can also be used to treat heavy periods.  Minipill. This type of birth control pill contains only the progesterone hormone. They are taken every day of each month and must be prescribed by your health care provider.  Birth control patch. The patch contains hormones similar to those in birth control pills. It must be changed once a week and is prescribed by a health care provider.  Vaginal ring. The ring contains hormones similar to those in birth control pills. It is left in the vagina for 3 weeks, removed for 1  week, and then a new one is put back in place. The patient must be comfortable inserting and removing the ring from the vagina.A health care provider's prescription is necessary.  Emergency contraception. Emergency contraceptives prevent pregnancy after unprotected sexual intercourse. This pill can be taken right after sex or up to 5 days after unprotected sex. It is most effective the sooner you take the pills after having sexual intercourse. Most emergency contraceptive pills are available without a prescription. Check with your pharmacist. Do not use emergency contraception as your only form of birth control. BARRIER METHODS   Female condom. This is a thin sheath (latex or rubber) that is worn over the penis during   sexual intercourse. It can be used with spermicide to increase effectiveness.  Female condom. This is a soft, loose-fitting sheath that is put into the vagina before sexual intercourse.  Diaphragm. This is a soft, latex, dome-shaped barrier that must be fitted by a health care provider. It is inserted into the vagina, along with a spermicidal jelly. It is inserted before intercourse. The diaphragm should be left in the vagina for 6 to 8 hours after intercourse.  Cervical cap. This is a round, soft, latex or plastic cup that fits over the cervix and must be fitted by a health care provider. The cap can be left in place for up to 48 hours after intercourse.  Sponge. This is a soft, circular piece of polyurethane foam. The sponge has spermicide in it. It is inserted into the vagina after wetting it and before sexual intercourse.  Spermicides. These are chemicals that kill or block sperm from entering the cervix and uterus. They come in the form of creams, jellies, suppositories, foam, or tablets. They do not require a prescription. They are inserted into the vagina with an applicator before having sexual intercourse. The process must be repeated every time you have sexual  intercourse. INTRAUTERINE CONTRACEPTION  Intrauterine device (IUD). This is a T-shaped device that is put in a woman's uterus during a menstrual period to prevent pregnancy. There are 2 types:  Copper IUD. This type of IUD is wrapped in copper wire and is placed inside the uterus. Copper makes the uterus and fallopian tubes produce a fluid that kills sperm. It can stay in place for 10 years.  Hormone IUD. This type of IUD contains the hormone progestin (synthetic progesterone). The hormone thickens the cervical mucus and prevents sperm from entering the uterus, and it also thins the uterine lining to prevent implantation of a fertilized egg. The hormone can weaken or kill the sperm that get into the uterus. It can stay in place for 3-5 years, depending on which type of IUD is used. PERMANENT METHODS OF CONTRACEPTION  Female tubal ligation. This is when the woman's fallopian tubes are surgically sealed, tied, or blocked to prevent the egg from traveling to the uterus.  Hysteroscopic sterilization. This involves placing a small coil or insert into each fallopian tube. Your doctor uses a technique called hysteroscopy to do the procedure. The device causes scar tissue to form. This results in permanent blockage of the fallopian tubes, so the sperm cannot fertilize the egg. It takes about 3 months after the procedure for the tubes to become blocked. You must use another form of birth control for these 3 months.  Female sterilization. This is when the female has the tubes that carry sperm tied off (vasectomy).This blocks sperm from entering the vagina during sexual intercourse. After the procedure, the man can still ejaculate fluid (semen). NATURAL PLANNING METHODS  Natural family planning. This is not having sexual intercourse or using a barrier method (condom, diaphragm, cervical cap) on days the woman could become pregnant.  Calendar method. This is keeping track of the length of each menstrual cycle  and identifying when you are fertile.  Ovulation method. This is avoiding sexual intercourse during ovulation.  Symptothermal method. This is avoiding sexual intercourse during ovulation, using a thermometer and ovulation symptoms.  Post-ovulation method. This is timing sexual intercourse after you have ovulated. Regardless of which type or method of contraception you choose, it is important that you use condoms to protect against the transmission of sexually transmitted infections (STIs).   Talk with your health care provider about which form of contraception is most appropriate for you.   This information is not intended to replace advice given to you by your health care provider. Make sure you discuss any questions you have with your health care provider.   Document Released: 06/02/2005 Document Revised: 06/07/2013 Document Reviewed: 11/25/2012 Elsevier Interactive Patient Education 2016 [employer] Inc[employer]  

## 2015-12-21 ENCOUNTER — Ambulatory Visit (HOSPITAL_COMMUNITY)
Admission: RE | Admit: 2015-12-21 | Discharge: 2015-12-21 | Disposition: A | Discharge status: Home / Self Care | Payer: Medicaid Other | Source: Ambulatory Visit | Attending: Obstetrics & Gynecology | Admitting: Obstetrics & Gynecology

## 2015-12-21 DIAGNOSIS — O359XX0 Maternal care for (suspected) fetal abnormality and damage, unspecified, not applicable or unspecified: Secondary | ICD-10-CM

## 2015-12-25 ENCOUNTER — Emergency Department (HOSPITAL_COMMUNITY): Payer: Medicaid Other

## 2015-12-25 ENCOUNTER — Emergency Department (HOSPITAL_COMMUNITY)
Admission: EM | Admit: 2015-12-25 | Discharge: 2015-12-25 | Disposition: A | Discharge status: Home / Self Care | Payer: Medicaid Other | Attending: Emergency Medicine | Admitting: Emergency Medicine

## 2015-12-25 ENCOUNTER — Ambulatory Visit (HOSPITAL_COMMUNITY)
Admission: RE | Admit: 2015-12-25 | Discharge: 2015-12-25 | Disposition: A | Discharge status: Home / Self Care | Payer: Medicaid Other | Source: Ambulatory Visit | Attending: Obstetrics & Gynecology | Admitting: Obstetrics & Gynecology

## 2015-12-25 ENCOUNTER — Encounter (HOSPITAL_COMMUNITY): Payer: Self-pay

## 2015-12-25 ENCOUNTER — Encounter (HOSPITAL_COMMUNITY): Payer: Self-pay | Admitting: Emergency Medicine

## 2015-12-25 VITALS — BP 116/70 | HR 79 | Wt 170.5 lb

## 2015-12-25 DIAGNOSIS — Y999 Unspecified external cause status: Secondary | ICD-10-CM | POA: Insufficient documentation

## 2015-12-25 DIAGNOSIS — O358XX0 Maternal care for other (suspected) fetal abnormality and damage, not applicable or unspecified: Secondary | ICD-10-CM | POA: Diagnosis not present

## 2015-12-25 DIAGNOSIS — Y929 Unspecified place or not applicable: Secondary | ICD-10-CM | POA: Insufficient documentation

## 2015-12-25 DIAGNOSIS — O9A213 Injury, poisoning and certain other consequences of external causes complicating pregnancy, third trimester: Secondary | ICD-10-CM | POA: Insufficient documentation

## 2015-12-25 DIAGNOSIS — Y939 Activity, unspecified: Secondary | ICD-10-CM | POA: Insufficient documentation

## 2015-12-25 DIAGNOSIS — O4592 Premature separation of placenta, unspecified, second trimester: Secondary | ICD-10-CM | POA: Insufficient documentation

## 2015-12-25 DIAGNOSIS — Z3A29 29 weeks gestation of pregnancy: Secondary | ICD-10-CM | POA: Diagnosis not present

## 2015-12-25 DIAGNOSIS — S022XXA Fracture of nasal bones, initial encounter for closed fracture: Secondary | ICD-10-CM | POA: Diagnosis not present

## 2015-12-25 DIAGNOSIS — R079 Chest pain, unspecified: Secondary | ICD-10-CM | POA: Diagnosis not present

## 2015-12-25 DIAGNOSIS — Z3490 Encounter for supervision of normal pregnancy, unspecified, unspecified trimester: Secondary | ICD-10-CM

## 2015-12-25 DIAGNOSIS — Q330 Congenital cystic lung: Secondary | ICD-10-CM

## 2015-12-25 MED ORDER — ACETAMINOPHEN 325 MG PO TABS
650.0000 mg | ORAL_TABLET | Freq: Four times a day (QID) | ORAL | Status: DC | PRN
  Administered 2015-12-25: 650 mg via ORAL
  Filled 2015-12-25: qty 2

## 2015-12-25 MED ORDER — SODIUM CHLORIDE 0.9 % IV BOLUS (SEPSIS)
1000.0000 mL | Freq: Once | INTRAVENOUS | Status: AC
  Administered 2015-12-25: 1000 mL via INTRAVENOUS
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
## 2015-12-25 NOTE — ED Notes (Signed)
This note also relates to the following rows which could not be included: BP - Cannot attach notes to unvalidated device data Pulse Rate - Cannot attach notes to unvalidated device data Resp - Cannot attach notes to unvalidated device data SpO2 - Cannot attach notes to unvalidated device data   To CT.
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
## 2015-12-27 ENCOUNTER — Telehealth (HOSPITAL_COMMUNITY): Payer: Self-pay | Admitting: Genetics

## 2015-12-27 NOTE — Telephone Encounter (Signed)
Attempted to contact Leah Morris regarding the change in the appointment date and time for her pediatric surgery consult with Dr. [facility]eller [facility].  There was no answer at the provided number, and the voicemail was in [place].  I did not leave a message.  Her new appointment is 8/9 at 10:30.

## 2016-01-03 ENCOUNTER — Encounter: Payer: Medicaid Other | Admitting: Family

## 2016-01-03 ENCOUNTER — Inpatient Hospital Stay (HOSPITAL_COMMUNITY)
Admission: AD | Admit: 2016-01-03 | Discharge: 2016-01-03 | Disposition: A | Discharge status: Home / Self Care | Payer: Medicaid Other | Source: Ambulatory Visit | Attending: Obstetrics & Gynecology | Admitting: Obstetrics & Gynecology

## 2016-01-03 ENCOUNTER — Encounter: Payer: Self-pay | Admitting: Family

## 2016-01-03 ENCOUNTER — Encounter (HOSPITAL_COMMUNITY): Payer: Self-pay | Admitting: *Deleted

## 2016-01-03 DIAGNOSIS — O9989 Other specified diseases and conditions complicating pregnancy, childbirth and the puerperium: Secondary | ICD-10-CM

## 2016-01-03 DIAGNOSIS — O26853 Spotting complicating pregnancy, third trimester: Secondary | ICD-10-CM | POA: Diagnosis present

## 2016-01-03 DIAGNOSIS — N930 Postcoital and contact bleeding: Secondary | ICD-10-CM

## 2016-01-03 DIAGNOSIS — Z3A31 31 weeks gestation of pregnancy: Secondary | ICD-10-CM | POA: Insufficient documentation

## 2016-01-03 DIAGNOSIS — O26893 Other specified pregnancy related conditions, third trimester: Secondary | ICD-10-CM | POA: Insufficient documentation

## 2016-01-03 LAB — URINALYSIS, ROUTINE W REFLEX MICROSCOPIC
BILIRUBIN URINE: NEGATIVE
Glucose, UA: NEGATIVE mg/dL
Ketones, ur: NEGATIVE mg/dL
LEUKOCYTES UA: NEGATIVE
NITRITE: NEGATIVE
PH: 6.5 (ref 5.0–8.0)
Protein, ur: NEGATIVE mg/dL
SPECIFIC GRAVITY, URINE: 1.015 (ref 1.005–1.030)

## 2016-01-03 LAB — URINE MICROSCOPIC-ADD ON: Bacteria, UA: NONE SEEN

## 2016-01-03 NOTE — MAU Note (Signed)
Pt reports she is having pain and spotting after intercourse tonight.

## 2016-01-03 NOTE — Discharge Instructions (Signed)
Vaginal Bleeding During Pregnancy, Third Trimester °A small amount of bleeding (spotting) from the vagina is relatively common in pregnancy. Various things can cause bleeding or spotting in pregnancy. Sometimes the bleeding is normal and is not a problem. However, bleeding during the third trimester can also be a sign of something serious for the mother and the baby. Be sure to tell your health care provider about any vaginal bleeding right away.  °Some possible causes of vaginal bleeding during the third trimester include:  °· The placenta may be partially or completely covering the opening to the cervix (placenta previa).   °· The placenta may have separated from the uterus (abruption of the placenta).   °· There may be an infection or growth on the cervix.   °· You may be starting labor, called discharging of the mucus plug.   °· The placenta may grow into the muscle layer of the uterus (placenta accreta).   °HOME CARE INSTRUCTIONS  °Watch your condition for any changes. The following actions may help to lessen any discomfort you are feeling:  °· Follow your health care provider's instructions for limiting your activity. If your health care provider orders bed rest, you may need to stay in bed and only get up to use the bathroom. However, your health care provider may allow you to continue light activity. °· If needed, make plans for someone to help with your regular activities and responsibilities while you are on bed rest. °· Keep track of the number of pads you use each day, how often you change pads, and how soaked (saturated) they are. Write this down. °· Do not use tampons. Do not douche. °· Do not have sexual intercourse or orgasms until approved by your health care provider. °· Follow your health care provider's advice about lifting, driving, and physical activities. °· If you pass any tissue from your vagina, save the tissue so you can show it to your health care provider.   °· Only take over-the-counter  or prescription medicines as directed by your health care provider. °· Do not take aspirin because it can make you bleed.   °· Keep all follow-up appointments as directed by your health care provider. °SEEK MEDICAL CARE IF: °· You have any vaginal bleeding during any part of your pregnancy. °· You have cramps or labor pains. °· You have a fever, not controlled by medicine. °SEEK IMMEDIATE MEDICAL CARE IF:  °· You have severe cramps or pain in your back or belly (abdomen). °· You have chills. °· You have a gush of fluid from the vagina. °· You pass large clots or tissue from your vagina. °· Your bleeding increases. °· You feel light-headed or weak. °· You pass out. °· You feel less movement or no movement of the baby.   °MAKE SURE YOU: °· Understand these instructions. °· Will watch your condition. °· Will get help right away if you are not doing well or get worse. °  °This information is not intended to replace advice given to you by your health care provider. Make sure you discuss any questions you have with your health care provider. °  °Document Released: 08/23/2002 Document Revised: 06/07/2013 Document Reviewed: 02/07/2013 °Elsevier Interactive Patient Education ©2016 [employer] ° °
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
## 2016-01-08 ENCOUNTER — Encounter: Payer: Medicaid Other | Admitting: Family Medicine

## 2016-01-08 ENCOUNTER — Ambulatory Visit (HOSPITAL_COMMUNITY): Admission: RE | Admit: 2016-01-08 | Payer: Medicaid Other | Source: Ambulatory Visit

## 2016-01-09 ENCOUNTER — Encounter (HOSPITAL_COMMUNITY): Payer: Self-pay

## 2016-01-09 ENCOUNTER — Ambulatory Visit (HOSPITAL_COMMUNITY)
Admission: RE | Admit: 2016-01-09 | Discharge: 2016-01-09 | Disposition: A | Discharge status: Home / Self Care | Payer: Medicaid Other | Source: Ambulatory Visit | Attending: Family | Admitting: Family

## 2016-01-09 DIAGNOSIS — Z3A31 31 weeks gestation of pregnancy: Secondary | ICD-10-CM | POA: Insufficient documentation

## 2016-01-09 DIAGNOSIS — O4593 Premature separation of placenta, unspecified, third trimester: Secondary | ICD-10-CM | POA: Insufficient documentation

## 2016-01-09 DIAGNOSIS — O358XX0 Maternal care for other (suspected) fetal abnormality and damage, not applicable or unspecified: Secondary | ICD-10-CM | POA: Diagnosis present

## 2016-01-09 DIAGNOSIS — Q330 Congenital cystic lung: Secondary | ICD-10-CM

## 2016-01-15 ENCOUNTER — Ambulatory Visit (INDEPENDENT_AMBULATORY_CARE_PROVIDER_SITE_OTHER): Payer: Medicaid Other | Admitting: Obstetrics and Gynecology

## 2016-01-15 VITALS — BP 130/74 | HR 80 | Wt 172.1 lb

## 2016-01-15 DIAGNOSIS — O4692 Antepartum hemorrhage, unspecified, second trimester: Secondary | ICD-10-CM

## 2016-01-15 DIAGNOSIS — Z3493 Encounter for supervision of normal pregnancy, unspecified, third trimester: Secondary | ICD-10-CM

## 2016-01-15 NOTE — Progress Notes (Signed)
Subjective:  Leah Morris is a 25 y.o. [phone] at [redacted]w[redacted]d being seen today for ongoing prenatal care.  She is currently monitored for the following issues for this low-risk pregnancy and has Supervision of low-risk pregnancy and Second trimester bleeding on her problem list.  Patient reports no complaints.  Contractions: Not present. Vag. Bleeding: None.  Movement: Present. Denies leaking of fluid. Denies Dysuria   The following portions of the patient's history were reviewed and updated as appropriate: allergies, current medications, past family history, past medical history, past social history, past surgical history and problem list. Problem list updated.  Objective:   Vitals:   01/15/16 1352  BP: 130/74  Pulse: 80  Weight: 172 lb 1.6 oz (78.1 kg)    Fetal Status: Fetal Heart Rate (bpm): 129 Fundal Height: 33 cm Movement: Present     General:  Alert, oriented and cooperative. Patient is in no acute distress.  Skin: Skin is warm and dry. No rash noted.   Cardiovascular: Normal heart rate noted  Respiratory: Normal respiratory effort, no problems with respiration noted  Abdomen: Soft, gravid, appropriate for gestational age. Pain/Pressure: Absent     Pelvic:  Cervical exam deferred        Extremities: Normal range of motion.  Edema: None  Mental Status: Normal mood and affect. Normal behavior. Normal judgment and thought content.   Urinalysis: Urine Protein: Negative Urine Glucose: Negative  Assessment and Plan:  Pregnancy: [id number] at [redacted]w[redacted]d  1. Supervision of low-risk pregnancy, third trimester  -  Return in 2 weeks  - Normal GTT 1 hour  - Confirmed appointment for pediatric surgery on 8/9 @ 1030 - Sent a second request in for previous Pap records. Last pap 2016   2. Second trimester bleeding  - Resolved -O positive blood type   Preterm labor symptoms and general obstetric precautions including but not limited to vaginal bleeding, contractions, leaking of fluid and fetal  movement were reviewed in detail with the patient. Please refer to After Visit Summary for other counseling recommendations.  Return in about 2 weeks (around 01/29/2016).   Duane Lope, NP

## 2016-01-22 ENCOUNTER — Encounter (HOSPITAL_COMMUNITY): Payer: Self-pay

## 2016-01-22 ENCOUNTER — Ambulatory Visit (HOSPITAL_COMMUNITY)
Admission: RE | Admit: 2016-01-22 | Discharge: 2016-01-22 | Disposition: A | Discharge status: Home / Self Care | Payer: Medicaid Other | Source: Ambulatory Visit | Attending: Obstetrics & Gynecology | Admitting: Obstetrics & Gynecology

## 2016-01-22 ENCOUNTER — Other Ambulatory Visit (HOSPITAL_COMMUNITY): Payer: Self-pay | Admitting: Maternal and Fetal Medicine

## 2016-01-22 DIAGNOSIS — Q330 Congenital cystic lung: Secondary | ICD-10-CM

## 2016-01-22 DIAGNOSIS — Z3A33 33 weeks gestation of pregnancy: Secondary | ICD-10-CM

## 2016-01-22 DIAGNOSIS — O359XX0 Maternal care for (suspected) fetal abnormality and damage, unspecified, not applicable or unspecified: Secondary | ICD-10-CM | POA: Insufficient documentation

## 2016-01-23 ENCOUNTER — Other Ambulatory Visit (HOSPITAL_COMMUNITY): Payer: Self-pay | Admitting: *Deleted

## 2016-01-23 DIAGNOSIS — O359XX0 Maternal care for (suspected) fetal abnormality and damage, unspecified, not applicable or unspecified: Secondary | ICD-10-CM

## 2016-01-30 ENCOUNTER — Other Ambulatory Visit (HOSPITAL_COMMUNITY)
Admission: RE | Admit: 2016-01-30 | Discharge: 2016-01-30 | Disposition: A | Discharge status: Home / Self Care | Payer: Medicaid Other | Source: Ambulatory Visit | Attending: Medical | Admitting: Medical

## 2016-01-30 ENCOUNTER — Ambulatory Visit (INDEPENDENT_AMBULATORY_CARE_PROVIDER_SITE_OTHER): Payer: Medicaid Other | Admitting: Medical

## 2016-01-30 VITALS — BP 120/66 | HR 89 | Wt 174.3 lb

## 2016-01-30 DIAGNOSIS — Z113 Encounter for screening for infections with a predominantly sexual mode of transmission: Secondary | ICD-10-CM | POA: Insufficient documentation

## 2016-01-30 DIAGNOSIS — Z3493 Encounter for supervision of normal pregnancy, unspecified, third trimester: Secondary | ICD-10-CM

## 2016-01-30 DIAGNOSIS — N898 Other specified noninflammatory disorders of vagina: Secondary | ICD-10-CM | POA: Diagnosis not present

## 2016-01-30 DIAGNOSIS — O26893 Other specified pregnancy related conditions, third trimester: Secondary | ICD-10-CM

## 2016-01-30 LAB — POCT URINALYSIS DIP (DEVICE)
BILIRUBIN URINE: NEGATIVE
Glucose, UA: NEGATIVE mg/dL
HGB URINE DIPSTICK: NEGATIVE
Ketones, ur: NEGATIVE mg/dL
Leukocytes, UA: NEGATIVE
NITRITE: NEGATIVE
PH: 7 (ref 5.0–8.0)
Protein, ur: NEGATIVE mg/dL
SPECIFIC GRAVITY, URINE: 1.02 (ref 1.005–1.030)
Urobilinogen, UA: 1 mg/dL (ref 0.0–1.0)

## 2016-01-30 LAB — OB RESULTS CONSOLE GC/CHLAMYDIA: Gonorrhea: NEGATIVE

## 2016-01-30 LAB — OB RESULTS CONSOLE GBS: STREP GROUP B AG: POSITIVE

## 2016-01-30 NOTE — Progress Notes (Signed)
Pt c/o vaginal discharge, itchy, with odor

## 2016-01-30 NOTE — Patient Instructions (Signed)
Fetal Movement Counts °Patient Name: __________________________________________________ Patient Due Date: ____________________ °Performing a fetal movement count is highly recommended in high-risk pregnancies, but it is good for every pregnant woman to do. Your health care provider may ask you to start counting fetal movements at 28 weeks of the pregnancy. Fetal movements often increase: °· After eating a full meal. °· After physical activity. °· After eating or drinking something sweet or cold. °· At rest. °Pay attention to when you feel the baby is most active. This will help you notice a pattern of your baby's sleep and wake cycles and what factors contribute to an increase in fetal movement. It is important to perform a fetal movement count at the same time each day when your baby is normally most active.  °HOW TO COUNT FETAL MOVEMENTS °1. Find a quiet and comfortable area to sit or lie down on your left side. Lying on your left side provides the best blood and oxygen circulation to your baby. °2. Write down the day and time on a sheet of paper or in a journal. °3. Start counting kicks, flutters, swishes, rolls, or jabs in a 2-hour period. You should feel at least 10 movements within 2 hours. °4. If you do not feel 10 movements in 2 hours, wait 2-3 hours and count again. Look for a change in the pattern or not enough counts in 2 hours. °SEEK MEDICAL CARE IF: °· You feel less than 10 counts in 2 hours, tried twice. °· There is no movement in over an hour. °· The pattern is changing or taking longer each day to reach 10 counts in 2 hours. °· You feel the baby is not moving as he or she usually does. °Date: ____________ Movements: ____________ Start time: ____________ Finish time: ____________  °Date: ____________ Movements: ____________ Start time: ____________ Finish time: ____________ °Date: ____________ Movements: ____________ Start time: ____________ Finish time: ____________ °Date: ____________ Movements:  ____________ Start time: ____________ Finish time: ____________ °Date: ____________ Movements: ____________ Start time: ____________ Finish time: ____________ °Date: ____________ Movements: ____________ Start time: ____________ Finish time: ____________ °Date: ____________ Movements: ____________ Start time: ____________ Finish time: ____________ °Date: ____________ Movements: ____________ Start time: ____________ Finish time: ____________  °Date: ____________ Movements: ____________ Start time: ____________ Finish time: ____________ °Date: ____________ Movements: ____________ Start time: ____________ Finish time: ____________ °Date: ____________ Movements: ____________ Start time: ____________ Finish time: ____________ °Date: ____________ Movements: ____________ Start time: ____________ Finish time: ____________ °Date: ____________ Movements: ____________ Start time: ____________ Finish time: ____________ °Date: ____________ Movements: ____________ Start time: ____________ Finish time: ____________ °Date: ____________ Movements: ____________ Start time: ____________ Finish time: ____________  °Date: ____________ Movements: ____________ Start time: ____________ Finish time: ____________ °Date: ____________ Movements: ____________ Start time: ____________ Finish time: ____________ °Date: ____________ Movements: ____________ Start time: ____________ Finish time: ____________ °Date: ____________ Movements: ____________ Start time: ____________ Finish time: ____________ °Date: ____________ Movements: ____________ Start time: ____________ Finish time: ____________ °Date: ____________ Movements: ____________ Start time: ____________ Finish time: ____________ °Date: ____________ Movements: ____________ Start time: ____________ Finish time: ____________  °Date: ____________ Movements: ____________ Start time: ____________ Finish time: ____________ °Date: ____________ Movements: ____________ Start time: ____________ Finish  time: ____________ °Date: ____________ Movements: ____________ Start time: ____________ Finish time: ____________ °Date: ____________ Movements: ____________ Start time: ____________ Finish time: ____________ °Date: ____________ Movements: ____________ Start time: ____________ Finish time: ____________ °Date: ____________ Movements: ____________ Start time: ____________ Finish time: ____________ °Date: ____________ Movements: ____________ Start time: ____________ Finish time: ____________  °Date: ____________ Movements: ____________ Start time: ____________ Finish   time: ____________ °Date: ____________ Movements: ____________ Start time: ____________ Finish time: ____________ °Date: ____________ Movements: ____________ Start time: ____________ Finish time: ____________ °Date: ____________ Movements: ____________ Start time: ____________ Finish time: ____________ °Date: ____________ Movements: ____________ Start time: ____________ Finish time: ____________ °Date: ____________ Movements: ____________ Start time: ____________ Finish time: ____________ °Date: ____________ Movements: ____________ Start time: ____________ Finish time: ____________  °Date: ____________ Movements: ____________ Start time: ____________ Finish time: ____________ °Date: ____________ Movements: ____________ Start time: ____________ Finish time: ____________ °Date: ____________ Movements: ____________ Start time: ____________ Finish time: ____________ °Date: ____________ Movements: ____________ Start time: ____________ Finish time: ____________ °Date: ____________ Movements: ____________ Start time: ____________ Finish time: ____________ °Date: ____________ Movements: ____________ Start time: ____________ Finish time: ____________ °Date: ____________ Movements: ____________ Start time: ____________ Finish time: ____________  °Date: ____________ Movements: ____________ Start time: ____________ Finish time: ____________ °Date: ____________  Movements: ____________ Start time: ____________ Finish time: ____________ °Date: ____________ Movements: ____________ Start time: ____________ Finish time: ____________ °Date: ____________ Movements: ____________ Start time: ____________ Finish time: ____________ °Date: ____________ Movements: ____________ Start time: ____________ Finish time: ____________ °Date: ____________ Movements: ____________ Start time: ____________ Finish time: ____________ °Date: ____________ Movements: ____________ Start time: ____________ Finish time: ____________  °Date: ____________ Movements: ____________ Start time: ____________ Finish time: ____________ °Date: ____________ Movements: ____________ Start time: ____________ Finish time: ____________ °Date: ____________ Movements: ____________ Start time: ____________ Finish time: ____________ °Date: ____________ Movements: ____________ Start time: ____________ Finish time: ____________ °Date: ____________ Movements: ____________ Start time: ____________ Finish time: ____________ °Date: ____________ Movements: ____________ Start time: ____________ Finish time: ____________ °  °This information is not intended to replace advice given to you by your health care provider. Make sure you discuss any questions you have with your health care provider. °  °Document Released: 07/02/2006 Document Revised: 06/23/2014 Document Reviewed: 03/29/2012 °Elsevier Interactive Patient Education ©2016 [employer] °Braxton Hicks Contractions °Contractions of the uterus can occur throughout pregnancy. Contractions are not always a sign that you are in labor.  °WHAT ARE BRAXTON HICKS CONTRACTIONS?  °Contractions that occur before labor are called Braxton Hicks contractions, or false labor. Toward the end of pregnancy (32-34 weeks), these contractions can develop more often and may become more forceful. This is not true labor because these contractions do not result in opening (dilatation) and thinning of  the cervix. They are sometimes difficult to tell apart from true labor because these contractions can be forceful and people have different pain tolerances. You should not feel embarrassed if you go to the hospital with false labor. Sometimes, the only way to tell if you are in true labor is for your health care provider to look for changes in the cervix. °If there are no prenatal problems or other health problems associated with the pregnancy, it is completely safe to be sent home with false labor and await the onset of true labor. °HOW CAN YOU TELL THE DIFFERENCE BETWEEN TRUE AND FALSE LABOR? °False Labor °· The contractions of false labor are usually shorter and not as hard as those of true labor.   °· The contractions are usually irregular.   °· The contractions are often felt in the front of the lower abdomen and in the groin.   °· The contractions may go away when you walk around or change positions while lying down.   °· The contractions get weaker and are shorter lasting as time goes on.   °· The contractions do not usually become progressively stronger, regular, and closer together as with true labor.   °True Labor °· Contractions in true   labor last 30-70 seconds, become very regular, usually become more intense, and increase in frequency.   °· The contractions do not go away with walking.   °· The discomfort is usually felt in the top of the uterus and spreads to the lower abdomen and low back.   °· True labor can be determined by your health care provider with an exam. This will show that the cervix is dilating and getting thinner.   °WHAT TO REMEMBER °· Keep up with your usual exercises and follow other instructions given by your health care provider.   °· Take medicines as directed by your health care provider.   °· Keep your regular prenatal appointments.   °· Eat and drink lightly if you think you are going into labor.   °· If Braxton Hicks contractions are making you uncomfortable:   °¨ Change your  position from lying down or resting to walking, or from walking to resting.   °¨ Sit and rest in a tub of warm water.   °¨ Drink 2-3 glasses of water. Dehydration may cause these contractions.   °¨ Do slow and deep breathing several times an hour.   °WHEN SHOULD I SEEK IMMEDIATE MEDICAL CARE? °Seek immediate medical care if: °· Your contractions become stronger, more regular, and closer together.   °· You have fluid leaking or gushing from your vagina.   °· You have a fever.   °· You pass blood-tinged mucus.   °· You have vaginal bleeding.   °· You have continuous abdominal pain.   °· You have low back pain that you never had before.   °· You feel your baby's head pushing down and causing pelvic pressure.   °· Your baby is not moving as much as it used to.   °  °This information is not intended to replace advice given to you by your health care provider. Make sure you discuss any questions you have with your health care provider. °  °Document Released: 06/02/2005 Document Revised: 06/07/2013 Document Reviewed: 03/14/2013 °Elsevier Interactive Patient Education ©2016 [employer] Inc. ° °

## 2016-01-30 NOTE — Progress Notes (Signed)
Subjective:  Leah Morris is a 25 y.o. [id number] at 7379w6d being seen today for ongoing prenatal care.  She is currently monitored for the following issues for this low-risk pregnancy and has Supervision of low-risk pregnancy and Second trimester bleeding on her problem list.  Patient reports vaginal itching.  Contractions: Irregular. Vag. Bleeding: None.  Movement: Present. Denies leaking of fluid.   The following portions of the patient's history were reviewed and updated as appropriate: allergies, current medications, past family history, past medical history, past social history, past surgical history and problem list. Problem list updated.  Objective:   Vitals:   01/30/16 0911  BP: 120/66  Pulse: 89  Weight: 174 lb 4.8 oz (79.1 kg)    Fetal Status: Fetal Heart Rate (bpm): 131 Fundal Height: 35 cm Movement: Present     General:  Alert, oriented and cooperative. Patient is in no acute distress.  Skin: Skin is warm and dry. No rash noted.   Cardiovascular: Normal heart rate noted  Respiratory: Normal respiratory effort, no problems with respiration noted  Abdomen: Soft, gravid, appropriate for gestational age. Pain/Pressure: Absent     Pelvic:  Cervical exam performed Dilation: Closed Effacement (%): Thick Station: Ballotable  Extremities: Normal range of motion.  Edema: None  Mental Status: Normal mood and affect. Normal behavior. Normal judgment and thought content.   Urinalysis: Urine Protein: Negative Urine Glucose: Negative  Assessment and Plan:  Pregnancy: [id number] at 1979w6d  1. Supervision of low-risk pregnancy, third trimester - Culture, beta strep (group b only) - GC/Chlamydia probe amp ([place])not at [facility]  2. Vaginal discharge during pregnancy, antepartum, third trimester - Wet prep, genital - GC[facility]Chlamydia probe amp ()[facility] - Patient will be called with any abnormal results  Preterm labor symptoms and general obstetric precautions including but  not limited to vaginal bleeding, contractions, leaking of fluid and fetal movement were reviewed in detail with the patient. Please refer to After Visit Summary for other counseling recommendations.  Return in about 2 weeks (around 02/13/2016) for LROB.   Marny LowensteinJulie N Ryo Klang, PA-C

## 2016-01-31 LAB — CULTURE, BETA STREP (GROUP B ONLY)

## 2016-01-31 LAB — WET PREP, GENITAL
Trich, Wet Prep: NONE SEEN
Yeast Wet Prep HPF POC: NONE SEEN

## 2016-02-01 LAB — GC/CHLAMYDIA PROBE AMP (~~LOC~~) NOT AT ARMC
Chlamydia: POSITIVE — AB
Neisseria Gonorrhea: NEGATIVE

## 2016-02-04 ENCOUNTER — Telehealth: Payer: Self-pay | Admitting: *Deleted

## 2016-02-04 ENCOUNTER — Other Ambulatory Visit: Payer: Self-pay | Admitting: Medical

## 2016-02-04 DIAGNOSIS — A749 Chlamydial infection, unspecified: Secondary | ICD-10-CM

## 2016-02-04 MED ORDER — AZITHROMYCIN 250 MG PO TABS: 1000.0000 mg | ORAL_TABLET | Freq: Once | ORAL | Status: DC

## 2016-02-04 MED ORDER — AZITHROMYCIN 250 MG PO TABS: 1000.0000 mg | ORAL_TABLET | Freq: Once | ORAL | 0 refills | Status: AC

## 2016-02-04 NOTE — Progress Notes (Signed)
Patient called into front office & I informed her of results, medication sent to pharmacy, the importance of her partner being treated & waiting at least 2 weeks before sexually active again. Patient verbalized understanding to all & asked if partner could come here for treatment. Told patient no but he can go to the [facility] std clinic & they can provide medication. Patient verbalized understanding & had no questions

## 2016-02-04 NOTE — Progress Notes (Signed)
I have left patient a message to call us back regarding lab results.

## 2016-02-06 ENCOUNTER — Ambulatory Visit (HOSPITAL_COMMUNITY): Admission: RE | Admit: 2016-02-06 | Payer: Medicaid Other | Source: Ambulatory Visit

## 2016-02-06 ENCOUNTER — Encounter (HOSPITAL_COMMUNITY): Payer: Self-pay

## 2016-02-06 ENCOUNTER — Encounter: Payer: Self-pay | Admitting: Family Medicine

## 2016-02-06 ENCOUNTER — Ambulatory Visit (HOSPITAL_COMMUNITY)
Admission: RE | Admit: 2016-02-06 | Discharge: 2016-02-06 | Disposition: A | Discharge status: Home / Self Care | Payer: Medicaid Other | Source: Ambulatory Visit | Attending: Obstetrics & Gynecology | Admitting: Obstetrics & Gynecology

## 2016-02-06 DIAGNOSIS — O359XX0 Maternal care for (suspected) fetal abnormality and damage, unspecified, not applicable or unspecified: Secondary | ICD-10-CM

## 2016-02-06 DIAGNOSIS — Z3A35 35 weeks gestation of pregnancy: Secondary | ICD-10-CM | POA: Diagnosis not present

## 2016-02-06 DIAGNOSIS — O358XX0 Maternal care for other (suspected) fetal abnormality and damage, not applicable or unspecified: Secondary | ICD-10-CM | POA: Diagnosis present

## 2016-02-08 ENCOUNTER — Encounter: Payer: Self-pay | Admitting: *Deleted

## 2016-02-12 ENCOUNTER — Telehealth: Payer: Self-pay | Admitting: *Deleted

## 2016-02-12 NOTE — Telephone Encounter (Addendum)
Pt left message yesterday requesting test results.  8/30  Pt called office this morning and requested a call back ASAP. I called her and left another message stating that we can leave her results on her voice mail with her permission. Please call back and state what results she is requesting as well as whether a message can be left on her voice mail.

## 2016-02-14 NOTE — Telephone Encounter (Signed)
Encounter opened in error

## 2016-02-17 ENCOUNTER — Inpatient Hospital Stay (HOSPITAL_COMMUNITY): Payer: Medicaid Other | Admitting: Anesthesiology

## 2016-02-17 ENCOUNTER — Encounter (HOSPITAL_COMMUNITY): Payer: Self-pay | Admitting: *Deleted

## 2016-02-17 ENCOUNTER — Inpatient Hospital Stay (HOSPITAL_COMMUNITY)
Admission: AD | Admit: 2016-02-17 | Discharge: 2016-02-19 | DRG: 775 | Disposition: A | Discharge status: Home / Self Care | Payer: Medicaid Other | Source: Ambulatory Visit | Attending: Obstetrics & Gynecology | Admitting: Obstetrics & Gynecology

## 2016-02-17 DIAGNOSIS — Z3A37 37 weeks gestation of pregnancy: Secondary | ICD-10-CM

## 2016-02-17 DIAGNOSIS — O99824 Streptococcus B carrier state complicating childbirth: Secondary | ICD-10-CM | POA: Diagnosis not present

## 2016-02-17 DIAGNOSIS — IMO0001 Reserved for inherently not codable concepts without codable children: Secondary | ICD-10-CM

## 2016-02-17 DIAGNOSIS — O4202 Full-term premature rupture of membranes, onset of labor within 24 hours of rupture: Secondary | ICD-10-CM | POA: Diagnosis not present

## 2016-02-17 DIAGNOSIS — O4290 Premature rupture of membranes, unspecified as to length of time between rupture and onset of labor, unspecified weeks of gestation: Secondary | ICD-10-CM | POA: Diagnosis present

## 2016-02-17 DIAGNOSIS — O4292 Full-term premature rupture of membranes, unspecified as to length of time between rupture and onset of labor: Principal | ICD-10-CM | POA: Diagnosis present

## 2016-02-17 LAB — TYPE AND SCREEN
ABO/RH(D): O POS
ANTIBODY SCREEN: NEGATIVE

## 2016-02-17 LAB — ABO/RH: ABO/RH(D): O POS

## 2016-02-17 LAB — POCT FERN TEST: POCT Fern Test: POSITIVE

## 2016-02-17 LAB — CBC
HEMATOCRIT: 34.2 % — AB (ref 36.0–46.0)
Hemoglobin: 12 g/dL (ref 12.0–15.0)
MCH: 31.5 pg (ref 26.0–34.0)
MCHC: 35.1 g/dL (ref 30.0–36.0)
MCV: 89.8 fL (ref 78.0–100.0)
PLATELETS: 179 10*3/uL (ref 150–400)
RBC: 3.81 MIL/uL — ABNORMAL LOW (ref 3.87–5.11)
RDW: 13.3 % (ref 11.5–15.5)
WBC: 11 10*3/uL — AB (ref 4.0–10.5)

## 2016-02-17 LAB — RPR: RPR Ser Ql: NONREACTIVE

## 2016-02-17 MED ORDER — PENICILLIN G POTASSIUM 5000000 UNITS IJ SOLR: 5.0000 10*6.[IU] | Freq: Once | INTRAVENOUS | Status: DC

## 2016-02-17 MED ORDER — SOD CITRATE-CITRIC ACID 500-334 MG/5ML PO SOLN: 30.0000 mL | ORAL | Status: DC | PRN

## 2016-02-17 MED ORDER — DEXTROSE 5 % IV SOLN
2.5000 10*6.[IU] | INTRAVENOUS | Status: DC
  Administered 2016-02-17 (×2): 2.5 10*6.[IU] via INTRAVENOUS
  Filled 2016-02-17 (×4): qty 2.5

## 2016-02-17 MED ORDER — LIDOCAINE HCL (PF) 1 % IJ SOLN
30.0000 mL | INTRAMUSCULAR | Status: DC | PRN
  Filled 2016-02-17: qty 30

## 2016-02-17 MED ORDER — LIDOCAINE HCL (PF) 1 % IJ SOLN: 30.0000 mL | INTRAMUSCULAR | Status: DC | PRN

## 2016-02-17 MED ORDER — TERBUTALINE SULFATE 1 MG/ML IJ SOLN
0.2500 mg | Freq: Once | INTRAMUSCULAR | Status: DC | PRN
  Filled 2016-02-17: qty 1

## 2016-02-17 MED ORDER — EPHEDRINE 5 MG/ML INJ
10.0000 mg | INTRAVENOUS | Status: DC | PRN
  Filled 2016-02-17: qty 4

## 2016-02-17 MED ORDER — SIMETHICONE 80 MG PO CHEW: 80.0000 mg | CHEWABLE_TABLET | ORAL | Status: DC | PRN

## 2016-02-17 MED ORDER — PENICILLIN G POTASSIUM 5000000 UNITS IJ SOLR: 2.5000 10*6.[IU] | INTRAMUSCULAR | Status: DC

## 2016-02-17 MED ORDER — BENZOCAINE-MENTHOL 20-0.5 % EX AERO
1.0000 "application " | INHALATION_SPRAY | CUTANEOUS | Status: DC | PRN
  Administered 2016-02-17: 1 via TOPICAL
  Filled 2016-02-17: qty 56

## 2016-02-17 MED ORDER — ACETAMINOPHEN 325 MG PO TABS
650.0000 mg | ORAL_TABLET | ORAL | Status: DC | PRN
  Administered 2016-02-18 (×2): 650 mg via ORAL
  Filled 2016-02-17 (×2): qty 2

## 2016-02-17 MED ORDER — OXYTOCIN BOLUS FROM INFUSION: 500.0000 mL | Freq: Once | INTRAVENOUS | Status: DC

## 2016-02-17 MED ORDER — ONDANSETRON HCL 4 MG/2ML IJ SOLN: 4.0000 mg | Freq: Four times a day (QID) | INTRAMUSCULAR | Status: DC | PRN

## 2016-02-17 MED ORDER — ZOLPIDEM TARTRATE 5 MG PO TABS: 5.0000 mg | ORAL_TABLET | Freq: Every evening | ORAL | Status: DC | PRN

## 2016-02-17 MED ORDER — PHENYLEPHRINE 40 MCG/ML (10ML) SYRINGE FOR IV PUSH (FOR BLOOD PRESSURE SUPPORT): 80.0000 ug | PREFILLED_SYRINGE | INTRAVENOUS | Status: DC | PRN

## 2016-02-17 MED ORDER — TETANUS-DIPHTH-ACELL PERTUSSIS 5-2.5-18.5 LF-MCG/0.5 IM SUSP: 0.5000 mL | Freq: Once | INTRAMUSCULAR | Status: DC

## 2016-02-17 MED ORDER — ONDANSETRON HCL 4 MG/2ML IJ SOLN: 4.0000 mg | INTRAMUSCULAR | Status: DC | PRN

## 2016-02-17 MED ORDER — OXYTOCIN 40 UNITS IN LACTATED RINGERS INFUSION - SIMPLE MED: 2.5000 [IU]/h | INTRAVENOUS | Status: DC

## 2016-02-17 MED ORDER — SODIUM CHLORIDE 0.9 % IV SOLN: 250.0000 mL | INTRAVENOUS | Status: DC | PRN

## 2016-02-17 MED ORDER — LACTATED RINGERS IV SOLN: INTRAVENOUS | Status: DC

## 2016-02-17 MED ORDER — OXYTOCIN BOLUS FROM INFUSION
500.0000 mL | Freq: Once | INTRAVENOUS | Status: AC
  Administered 2016-02-17: 500 mL via INTRAVENOUS

## 2016-02-17 MED ORDER — SODIUM CHLORIDE 0.9% FLUSH: 3.0000 mL | INTRAVENOUS | Status: DC | PRN

## 2016-02-17 MED ORDER — FLEET ENEMA 7-19 GM/118ML RE ENEM: 1.0000 | ENEMA | RECTAL | Status: DC | PRN

## 2016-02-17 MED ORDER — LACTATED RINGERS IV SOLN
500.0000 mL | INTRAVENOUS | Status: DC | PRN
Start: 2016-02-17 — End: 2016-02-17

## 2016-02-17 MED ORDER — SENNOSIDES-DOCUSATE SODIUM 8.6-50 MG PO TABS
2.0000 | ORAL_TABLET | ORAL | Status: DC
  Administered 2016-02-17 – 2016-02-18 (×2): 2 via ORAL
  Filled 2016-02-17 (×2): qty 2

## 2016-02-17 MED ORDER — SODIUM CHLORIDE 0.9% FLUSH: 3.0000 mL | Freq: Two times a day (BID) | INTRAVENOUS | Status: DC

## 2016-02-17 MED ORDER — LIDOCAINE HCL (PF) 1 % IJ SOLN
INTRAMUSCULAR | Status: DC | PRN
  Administered 2016-02-17 (×2): 4 mL via EPIDURAL

## 2016-02-17 MED ORDER — OXYTOCIN 40 UNITS IN LACTATED RINGERS INFUSION - SIMPLE MED
1.0000 m[IU]/min | INTRAVENOUS | Status: DC
  Administered 2016-02-17: 2 m[IU]/min via INTRAVENOUS
  Filled 2016-02-17: qty 1000

## 2016-02-17 MED ORDER — MEASLES, MUMPS & RUBELLA VAC ~~LOC~~ INJ
0.5000 mL | INJECTION | Freq: Once | SUBCUTANEOUS | Status: DC
  Filled 2016-02-17: qty 0.5

## 2016-02-17 MED ORDER — OXYCODONE-ACETAMINOPHEN 5-325 MG PO TABS: 1.0000 | ORAL_TABLET | ORAL | Status: DC | PRN

## 2016-02-17 MED ORDER — PENICILLIN G POTASSIUM 5000000 UNITS IJ SOLR
5.0000 10*6.[IU] | Freq: Once | INTRAVENOUS | Status: AC
  Administered 2016-02-17: 5 10*6.[IU] via INTRAVENOUS
  Filled 2016-02-17: qty 5

## 2016-02-17 MED ORDER — LACTATED RINGERS IV SOLN: 500.0000 mL | Freq: Once | INTRAVENOUS | Status: DC

## 2016-02-17 MED ORDER — FENTANYL 2.5 MCG/ML BUPIVACAINE 1/10 % EPIDURAL INFUSION (WH - ANES)
14.0000 mL/h | INTRAMUSCULAR | Status: DC | PRN
  Administered 2016-02-17: 14 mL/h via EPIDURAL
  Filled 2016-02-17: qty 125

## 2016-02-17 MED ORDER — DIPHENHYDRAMINE HCL 25 MG PO CAPS: 25.0000 mg | ORAL_CAPSULE | Freq: Four times a day (QID) | ORAL | Status: DC | PRN

## 2016-02-17 MED ORDER — DIPHENHYDRAMINE HCL 50 MG/ML IJ SOLN
12.5000 mg | INTRAMUSCULAR | Status: DC | PRN
  Administered 2016-02-17: 12.5 mg via INTRAVENOUS
  Filled 2016-02-17: qty 1

## 2016-02-17 MED ORDER — EPHEDRINE 5 MG/ML INJ: 10.0000 mg | INTRAVENOUS | Status: DC | PRN

## 2016-02-17 MED ORDER — COCONUT OIL OIL: 1.0000 "application " | TOPICAL_OIL | Status: DC | PRN

## 2016-02-17 MED ORDER — ACETAMINOPHEN 325 MG PO TABS: 650.0000 mg | ORAL_TABLET | ORAL | Status: DC | PRN

## 2016-02-17 MED ORDER — OXYCODONE-ACETAMINOPHEN 5-325 MG PO TABS: 2.0000 | ORAL_TABLET | ORAL | Status: DC | PRN

## 2016-02-17 MED ORDER — PHENYLEPHRINE 40 MCG/ML (10ML) SYRINGE FOR IV PUSH (FOR BLOOD PRESSURE SUPPORT)
80.0000 ug | PREFILLED_SYRINGE | INTRAVENOUS | Status: DC | PRN
  Filled 2016-02-17: qty 5
  Filled 2016-02-17: qty 10

## 2016-02-17 MED ORDER — IBUPROFEN 600 MG PO TABS
600.0000 mg | ORAL_TABLET | Freq: Four times a day (QID) | ORAL | Status: DC
  Administered 2016-02-17 – 2016-02-19 (×6): 600 mg via ORAL
  Filled 2016-02-17 (×6): qty 1

## 2016-02-17 MED ORDER — LACTATED RINGERS IV SOLN: 500.0000 mL | INTRAVENOUS | Status: DC | PRN

## 2016-02-17 MED ORDER — ONDANSETRON HCL 4 MG PO TABS: 4.0000 mg | ORAL_TABLET | ORAL | Status: DC | PRN

## 2016-02-17 MED ORDER — PRENATAL MULTIVITAMIN CH
1.0000 | ORAL_TABLET | Freq: Every day | ORAL | Status: DC
  Administered 2016-02-18: 1 via ORAL
  Filled 2016-02-17: qty 1

## 2016-02-17 MED ORDER — LACTATED RINGERS IV SOLN
INTRAVENOUS | Status: DC
  Administered 2016-02-17: 08:00:00 via INTRAVENOUS

## 2016-02-17 NOTE — Progress Notes (Signed)
Leah Morris is a 25 y.o. [phone] at 4860w3d by ultrasound admitted for rupture of membranes  Subjective:   Objective: BP (!) 96/59   Pulse 88   Temp 98.1 F (36.7 C) (Oral)   Resp 18   Ht 5\' 4"  (1.626 m)   Wt 174 lb (78.9 kg)   LMP 04/13/2015   BMI 29.87 kg/m  No intake/output data recorded. No intake/output data recorded.  FHT:  FHR: 120's bpm, variability: moderate,  accelerations:  Present,  decelerations:  Present variables, and earlies  UC:   regular, every 3-4 minutes SVE:   Dilation: 8.5 Effacement (%): 100 Station: 0 Exam by:: [employer]ashley ellis cnm student  Labs: Lab Results  Component Value Date   WBC 11.0 (H) 02/17/2016   HGB 12.0 02/17/2016   HCT 34.2 (L) 02/17/2016   MCV 89.8 02/17/2016   PLT 179 02/17/2016    Assessment / Plan: Augmentation of labor, progressing well  Labor: Progressing normally Preeclampsia:  no signs or symptoms of toxicity, intake and ouput balanced and labs stable Fetal Wellbeing:  Category I Pain Control:  Epidural I/D:  n/a Anticipated MOD:  NSVD  Renetta ChalkAshley Ellis 02/17/2016, 7:00 PM

## 2016-02-17 NOTE — Anesthesia Preprocedure Evaluation (Signed)
Anesthesia Evaluation  Patient identified by MRN, date of birth, ID band Patient awake    Reviewed: Allergy & Precautions, H&P , Patient's Chart, lab work & pertinent test results  Airway Mallampati: III  TM Distance: >3 FB Neck ROM: full    Dental no notable dental hx. (+) Teeth Intact   Pulmonary neg pulmonary ROS,    Pulmonary exam normal breath sounds clear to auscultation       Cardiovascular negative cardio ROS Normal cardiovascular exam Rhythm:regular Rate:Normal     Neuro/Psych negative neurological ROS  negative psych ROS   GI/Hepatic negative GI ROS, Neg liver ROS,   Endo/Other  negative endocrine ROS  Renal/GU negative Renal ROS  negative genitourinary   Musculoskeletal   Abdominal   Peds  Hematology negative hematology ROS (+)   Anesthesia Other Findings   Reproductive/Obstetrics (+) Pregnancy                             Lab Results  Component Value Date   WBC 11.0 (H) 02/17/2016   HGB 12.0 02/17/2016   HCT 34.2 (L) 02/17/2016   MCV 89.8 02/17/2016   PLT 179 02/17/2016    Anesthesia Physical Anesthesia Plan  ASA: II  Anesthesia Plan: Epidural   Post-op Pain Management:    Induction:   Airway Management Planned:   Additional Equipment:   Intra-op Plan:   Post-operative Plan:   Informed Consent: I have reviewed the patients History and Physical, chart, labs and discussed the procedure including the risks, benefits and alternatives for the proposed anesthesia with the patient or authorized representative who has indicated his/her understanding and acceptance.     Plan Discussed with: Anesthesiologist  Anesthesia Plan Comments:         Anesthesia Quick Evaluation

## 2016-02-17 NOTE — H&P (Signed)
LABOR AND DELIVERY ADMISSION HISTORY AND PHYSICAL NOTE  Leah Morris is a 25 y.o. female [phone] with IUP at 4527w3d by 16 week U?S presenting for PROM.  States felt big gush of fluid around 3:30am, clear. She reports positive fetal movement. She denies vaginal bleeding.  Prenatal History/Complications: none  Past Medical History: Past Medical History:  Diagnosis Date  . Medical history non-contributory     Past Surgical History: Past Surgical History:  Procedure Laterality Date  . DILATION AND CURETTAGE OF UTERUS  2010    Obstetrical History: OB History    Gravida Para Term Preterm AB Living   6 1 1  0 4 1   SAB TAB Ectopic Multiple Live Births   2 2 0 0 1      Social History: Social History   Social History  . Marital status: Married    Spouse name: N/A  . Number of children: N/A  . Years of education: N/A   Social History Main Topics  . Smoking status: Never Smoker  . Smokeless tobacco: Never Used  . Alcohol use No  . Drug use: No  . Sexual activity: Yes   Other Topics Concern  . None   Social History Narrative  . None    Family History: History reviewed. No pertinent family history.  Allergies: No Known Allergies  Prescriptions Prior to Admission  Medication Sig Dispense Refill Last Dose  . Prenat w/o A Vit-FeFum-FePo-FA (CONCEPT OB) 130-92.4-1 MG CAPS Take 1 capsule by mouth daily. 30 capsule 11 Taking     Review of Systems   All systems reviewed and negative except as stated in HPI  Blood pressure 123/62, pulse 86, temperature 97.8 F (36.6 C), resp. rate 18, height 5\' 4"  (1.626 m), weight 80.5 kg (177 lb 6.4 oz), last menstrual period 04/13/2015. General appearance: alert, cooperative and no distress Lungs: no respiratory distress Heart: regular rate Abdomen: soft, non-tender Extremities: No calf swelling or tenderness Presentation: cephalic by cervical exam by RN in MAU Fetal monitoring: 130 baseline, moderate variability,  +accelerations, no decelerations Uterine activity: irreg Dilation: 2.5 Effacement (%): 70 Station: -2 Exam by:: g morris rn   Prenatal labs: ABO, Rh: O/POS/-- (04/20 1446) Antibody: NEG (04/20 1446) Rubella: immune RPR: NON REAC (06/19 1552)  HBsAg: NEGATIVE (04/20 1446)  HIV: NONREACTIVE (06/19 1552)  GBS:   positive Genetic screening:  neg Anatomy US: echogenic region in R chest wall  Prenatal Transfer Tool  Maternal Diabetes: No Genetic Screening: Normal Maternal Ultrasounds/Referrals: Abnormal:  Findings:   Other: echogenic region in R chest wall Fetal Ultrasounds or other Referrals:  Referred to Materal Fetal Medicine  Maternal Substance Abuse:  No Significant Maternal Medications:  None Significant Maternal Lab Results: Lab values include: Group B Strep positive  Results for orders placed or performed during the hospital encounter of 02/17/16 (from the past 24 hour(s))  Fern Test   Collection Time: 02/17/16  5:28 AM  Result Value Ref Range   POCT Fern Test Positive = ruptured amniotic membanes     Patient Active Problem List   Diagnosis Date Noted  . Supervision of low-risk pregnancy 10/04/2015  . Second trimester bleeding 10/04/2015    Assessment: Leah Morris is a 25 y.o. [id number] at 6827w3d here for PROM  #Labor: consider augmentation with foley bulb #Pain: Planning on epidural #FWB: Category I #ID:  GBS pos #MOF: both #MOC:considering POPs #Circ:  outpatient  Leland HerElsia J Yoo, DO PGY-1 9/3/20176:59 AM   OB FELLOW HISTORY AND PHYSICAL ATTESTATION  I have seen and examined this patient; I agree with above documentation in the resident's note.    Jen Mow, DO OB Fellow 02/17/2016, 9:33 AM

## 2016-02-17 NOTE — Anesthesia Procedure Notes (Signed)
Epidural Patient location during procedure: OB Start time: 02/17/2016 3:31 PM  Staffing Anesthesiologist: Mal AmabileFOSTER, Deejay Koppelman Performed: anesthesiologist   Preanesthetic Checklist Completed: patient identified, site marked, surgical consent, pre-op evaluation, timeout performed, IV checked, risks and benefits discussed and monitors and equipment checked  Epidural Patient position: sitting Prep: site prepped and draped and DuraPrep Patient monitoring: continuous pulse ox and blood pressure Approach: midline Location: L4-L5 Injection technique: LOR air  Needle:  Needle type: Tuohy  Needle gauge: 17 G Needle length: 9 cm and 9 Needle insertion depth: 4 cm Catheter type: closed end flexible Catheter size: 19 Gauge Catheter at skin depth: 9 cm Test dose: negative and Other  Assessment Events: blood not aspirated, injection not painful, no injection resistance, negative IV test and no paresthesia  Additional Notes Patient identified. Risks and benefits discussed including failed block, incomplete  Pain control, post dural puncture headache, nerve damage, paralysis, blood pressure Changes, nausea, vomiting, reactions to medications-both toxic and allergic and post Partum back pain. All questions were answered. Patient expressed understanding and wished to proceed. Sterile technique was used throughout procedure. Epidural site was Dressed with sterile barrier dressing. No paresthesias, signs of intravascular injection Or signs of intrathecal spread were encountered.  Patient was more comfortable after the epidural was dosed. Please see RN's note for documentation of vital signs and FHR which are stable.

## 2016-02-17 NOTE — Progress Notes (Signed)
Family at bs to care for 25 year old. fhr intermittent at times, pt has 25 year old in lap.

## 2016-02-17 NOTE — Progress Notes (Signed)
Patients friends arrived and are at bedside.
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
## 2016-02-17 NOTE — Anesthesia Pain Management Evaluation Note (Signed)
  CRNA Pain Management Visit Note  Patient: Leah Morris, 25 y.o., female  "Hello I am a member of the anesthesia team at [facility]. We have an anesthesia team available at all times to provide care throughout the hospital, including epidural management and anesthesia for C-section. I don't know your plan for the delivery whether it a natural birth, water birth, IV sedation, nitrous supplementation, doula or epidural, but we want to meet your pain goals."   1.Was your pain managed to your expectations on prior hospitalizations?   Yes   2.What is your expectation for pain management during this hospitalization?     Epidural  3.How can we help you reach that goal? Epidural being placed at time of visit Record the patient's initial score and the patient's pain goal.   Pain: Epidural being placed at time of visit Goal: 5 [facility] wants you to be able to say your pain was always managed very well.  Rica RecordsICKELTON,Leah Morris

## 2016-02-17 NOTE — MAU Note (Signed)
Went to BR at 0400 and had mucous d/c. Went back to bed and coughed and leaked a lot of fld. Has cont to leak since then off and on. Fld is clear. No pain. Decrease FM since leaking fld.

## 2016-02-17 NOTE — MAU Note (Signed)
Patient not on efm due to toddler running about the room, efm reactive prior, patient attempting to contact someone to come get her child.

## 2016-02-17 NOTE — Progress Notes (Signed)
Tiffiney Katrinka BlazingSmith is a 25 y.o. [phone] [id number] at 4858w3d by ultrasound admitted for rupture of membranes  Subjective:   Objective: BP 114/69   Pulse 90   Temp 98 F (36.7 C) (Oral)   Resp 20   Ht 5\' 4"  (1.626 m)   Wt 78.9 kg (174 lb)   LMP 04/13/2015   BMI 29.87 kg/m  No intake/output data recorded. No intake/output data recorded.  FHT:  FHR: 130's bpm, variability: moderate,  accelerations:  Present,  decelerations:  Absent UC:   irregular, every 2-4 minutes SVE:   Dilation: 2.5 Effacement (%): 70 Station: -2 Exam by:: g morris rn  Labs: Lab Results  Component Value Date   WBC 11.0 (H) 02/17/2016   HGB 12.0 02/17/2016   HCT 34.2 (L) 02/17/2016   MCV 89.8 02/17/2016   PLT 179 02/17/2016    Assessment / Plan: yet to be in labor, oxytocin started   Labor: yet to be in labor  Preeclampsia:  no signs or symptoms of toxicity, intake and ouput balanced and labs stable Fetal Wellbeing:  Category I Pain Control:  Labor support without medications I/D:  n/a Anticipated MOD:  NSVD  [name] 02/17/2016, 11:59 AM

## 2016-02-17 NOTE — Progress Notes (Signed)
Dr Omer JackMumaw notified of pt's VE, SROM, contraction pattern, orders received to admit pt

## 2016-02-17 NOTE — Progress Notes (Signed)
Leah Morris is a 25 y.o. [phone] at 8530w3d by ultrasound admitted for rupture of membranes  Subjective:   Objective: BP (!) 96/59   Pulse 88   Temp 97.9 F (36.6 C) (Oral)   Resp 20   Ht 5\' 4"  (1.626 m)   Wt 174 lb (78.9 kg)   LMP 04/13/2015   BMI 29.87 kg/m  No intake/output data recorded. No intake/output data recorded.  FHT:  FHR: 125 bpm, variability: moderate,  accelerations:  Present,  decelerations:  Absent UC:   regular, every 3-4 minutes SVE:   Dilation: 4.5 Effacement (%): 100 Station: -1 Exam by:: patti moore rn  Labs: Lab Results  Component Value Date   WBC 11.0 (H) 02/17/2016   HGB 12.0 02/17/2016   HCT 34.2 (L) 02/17/2016   MCV 89.8 02/17/2016   PLT 179 02/17/2016    Assessment / Plan: augmentation of labor, progressing slowly   Labor: progressing slowly Preeclampsia:  no signs or symptoms of toxicity, intake and ouput balanced and labs stable Fetal Wellbeing:  Category I Pain Control:  Epidural I/D:  n/a Anticipated MOD:  NSVD  Leah Morris 02/17/2016, 4:45 PM

## 2016-02-18 DIAGNOSIS — IMO0001 Reserved for inherently not codable concepts without codable children: Secondary | ICD-10-CM

## 2016-02-18 NOTE — Progress Notes (Signed)
Patient ID: Leah Morris, female   DOB: [birth year], 25 y.o.   MRN: [id number]  POSTPARTUM PROGRESS NOTE  Post Partum Day 1  Subjective:  Leah Morris is a 25 y.o. [id number]  s/p NSVD at 5962w3d.  No acute events overnight.  Pt denies problems with ambulating, voiding or po intake.  She denies nausea or vomiting.  Pain is well controlled.  Lochia Small.   Objective: Blood pressure (!) 111/54, pulse 83, temperature 98 F (36.7 C), temperature source Oral, resp. rate 20, height 5\' 4"  (1.626 m), weight 174 lb (78.9 kg), last menstrual period 04/13/2015, SpO2 97 %, unknown if currently breastfeeding.  Physical Exam:  General: alert, cooperative and no distress Lochia:normal flow Chest: no respiratory distress Heart:regular rate, distal pulses intact Abdomen: soft, nontender,  Uterine Fundus: firm, appropriately tender DVT Evaluation: No calf swelling or tenderness Extremities: no edema   Recent Labs  02/17/16 0805  HGB 12.0  HCT 34.2*    Assessment/Plan:  ASSESSMENT: Leah Morris is a 25 y.o. [id number] 5762w3d s/p NSVD. Bottle feeding. Planning for outpatient circumcision.   Plan for discharge tomorrow and Contraception OCPs   LOS: 1 day   Kandra NicolasJulie P DegeleMD 02/18/2016, 7:51 AM   CNM attestation Post Partum Day #1 I have seen and examined this patient and agree with above documentation in the resident's note.   Leah Morris is a 25 y.o. [id number] s/p SVD.  Pt denies problems with ambulating, voiding or po intake. Pain is well controlled.  Plan for birth control is oral contraceptives (estrogen/progesterone).  Method of Feeding: bottle  PE:  BP (!) 120/56 (BP Location: Right Arm)   Pulse 80   Temp 98 F (36.7 C) (Tympanic)   Resp 18   Ht 5\' 4"  (1.626 m)   Wt 78.9 kg (174 lb)   LMP 04/13/2015   SpO2 98%   Breastfeeding? Unknown   BMI 29.87 kg/m  Fundus firm  Plan for discharge: 02/19/16  Cam HaiSHAW, KIMBERLY, CNM 11:08 AM  02/18/2016
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
## 2016-02-19 MED ORDER — IBUPROFEN 600 MG PO TABS: 600.0000 mg | ORAL_TABLET | Freq: Four times a day (QID) | ORAL | 0 refills | Status: DC

## 2016-02-19 MED ORDER — NORGESTIMATE-ETH ESTRADIOL 0.25-35 MG-MCG PO TABS: 1.0000 | ORAL_TABLET | Freq: Every day | ORAL | 11 refills | Status: DC

## 2016-02-19 NOTE — Discharge Instructions (Signed)

## 2016-02-19 NOTE — Discharge Summary (Signed)
OB Discharge Summary     Patient Name: Leah Morris DOB: [birth year] MRN: [id number]  Date of admission: 02/17/2016 Delivering MD: Zerita Boers   Date of discharge: 02/19/2016  Admitting diagnosis: 37 weeks and 4 days water leaking and lack of fetal movement Intrauterine pregnancy: [redacted]w[redacted]d     Secondary diagnosis:  Principal Problem:   Status post normal vaginal delivery  Additional problems: CPAM Dx'd antenatally, but lesion decreased in size on subsequent US's. No respiratory concerns at delivery.      Discharge diagnosis: Term Pregnancy Delivered                                                                                                Post partum procedures:None  Augmentation: Pitocin  Complications: None  Hospital course:  Onset of Labor With Vaginal Delivery     25 y.o. yo [id number] at [redacted]w[redacted]d was admitted in Latent Labor on 02/17/2016. Patient had an uncomplicated labor course as follows:  Membrane Rupture Time/Date: 3:30 AM ,02/17/2016   Intrapartum Procedures: Episiotomy: None [1]                                         Lacerations:  None [1]  Patient had a delivery of a Viable infant. 02/17/2016  Information for the patient's newborn:  Nandi, Tonnesen Rheannon [[id number]]  Delivery Method: Vaginal, Spontaneous Delivery (Filed from Delivery Summary)    Pateint had an uncomplicated postpartum course.  She is ambulating, tolerating a regular diet, passing flatus, and urinating well. Patient is discharged home in stable condition on 02/19/16.    Physical exam Vitals:   02/18/16 0340 02/18/16 0808 02/18/16 1840 02/19/16 0500  BP: (!) 111/54 (!) 120/56 125/71 113/67  Pulse: 83 80 83 79  Resp: 20 18 18 18   Temp: 98 F (36.7 C) 98 F (36.7 C) 97.6 F (36.4 C) 97.9 F (36.6 C)  TempSrc: Oral Tympanic Oral Oral  SpO2:  98%    Weight:      Height:       General: alert, cooperative and no distress Lochia: appropriate Uterine Fundus: firm Incision: N/A DVT Evaluation:  Negative Homan's sign. Labs: Lab Results  Component Value Date   WBC 11.0 (H) 02/17/2016   HGB 12.0 02/17/2016   HCT 34.2 (L) 02/17/2016   MCV 89.8 02/17/2016   PLT 179 02/17/2016   No flowsheet data found.  Discharge instruction: per After Visit Summary and "Baby and Me Booklet".  After visit meds:    Medication List    TAKE these medications   ibuprofen 600 MG tablet Commonly known as:  ADVIL,MOTRIN Take 1 tablet (600 mg total) by mouth every 6 (six) hours.       Diet: routine diet  Activity: Advance as tolerated. Pelvic rest for 6 weeks.   Outpatient follow up:6 weeks Follow up Appt:Future Appointments Date Time Provider Department Center  02/20/2016 3:00 PM [facility] [place] 3 [facility]   Follow up Visit:No Follow-up on file. Follow-up Information    [facility] Schedule an appointment as soon as possible for a visit in 6 week(s).   Specialty:  Obstetrics and Gynecology Why:  postpartum visit Contact information: [address] [place] [phone]       [facility] AD[facility] .   Why:  as needed in emergencies Contact information: [address] [id number] [place] [phone]           Medication List    TAKE these medications   ibuprofen 600 MG tablet Commonly known as:  ADVIL,MOTRIN Take 1 tablet (600 mg total) by mouth every 6 (six) hours.       Postpartum contraception: Combination OCPs  Newborn Data: Live born female  Birth Weight: 6 lb 11.6 oz (3050 g) APGAR: 9, 9  Baby Feeding: Bottle Disposition:home with mother   02/19/2016 Dorathy KinsmanVirginia Albano, CNM

## 2016-02-20 ENCOUNTER — Ambulatory Visit: Payer: Self-pay

## 2016-02-20 ENCOUNTER — Other Ambulatory Visit (HOSPITAL_COMMUNITY): Payer: Medicaid Other

## 2016-02-20 NOTE — Anesthesia Postprocedure Evaluation (Signed)
Anesthesia Post Note  Patient: Leah Morris  Procedure(s) Performed: * No procedures listed *  Anesthesia Type: Epidural Vital Signs Assessment: post-procedure vital signs reviewed and stable Anesthetic complications: no    Last Vitals: There were no vitals filed for this visit.  Last Pain: There were no vitals filed for this visit.               Janeil Schexnayder JENNETTE

## 2016-02-20 NOTE — Lactation Note (Signed)
This note was copied from a baby's chart. Lactation Consultation Note: Staff nurse request to see this mother who has been exclusively bottle feeding. Mother has bilateral nipple piercing's. Mother states that she wanted to breastfeed and she couldn't get the rings off. Mother is asking for assistance. When I arrived in room mother was successful in getting the (R) ring off.  Assist with latching infant to (R) breast. Infant latched on and off for 10-15 mins. Mother was given a hand pump with a #27 flange. Mother is massaging and pumping the (R) breast. She is still trying  to get the (L) ring off. Mother states that she will have her husband to assist when he arrived.  Advised mother to hand pump for 15 mins. Mother pumped 30 ml with hand pump on the (R). Mother to massage and ice (L) breast. Staff nurse to sat up DEBP when needed.  Mother advised that infant would need to be feed every 2-3 hours, at least 8-12 times in 24 hours. Mother to call for assistance as needed.   Patient Name: Boy Tasheka Katrinka BlazingSmith LKGMW'NToday's Date: 02/20/2016     Maternal Data    Feeding    LATCH Score/Interventions                      Lactation Tools Discussed/Used     Consult Status      Michel BickersKendrick, Jacoba Cherney McCoy 02/20/2016, 2:57 PM

## 2016-02-21 ENCOUNTER — Encounter: Payer: Medicaid Other | Admitting: Family Medicine

## 2016-02-21 ENCOUNTER — Ambulatory Visit: Payer: Self-pay

## 2016-02-21 NOTE — Lactation Note (Signed)
This note was copied from a baby's chart. Lactation Consultation Note  Mother's breasts are filling and mother have been pumping for long periods and more often than needed. Recommend mother pump for 10-15 min every 2-3 hours. Massage breasts and apply ice packs to top and bottom of breasts q3 10-15 min. Took mother's tight bra off and encouraged lying flat in bed and massage away from nipple. Mother expressed increased comfort. Mother has been pumping approx 50-7360ml per session.   Patient Name: Leah Morris XBJYN'WToday's Date: 02/21/2016 Reason for consult: Follow-up assessment   Maternal Data    Feeding Feeding Type: Bottle Fed - Breast Milk Nipple Type: Slow - flow  LATCH Score/Interventions                      Lactation Tools Discussed/Used     Consult Status Consult Status: Complete    Leah Morris, Leah Morris 02/21/2016, 9:12 AM

## 2016-02-22 ENCOUNTER — Ambulatory Visit: Payer: Self-pay

## 2016-02-22 NOTE — Lactation Note (Signed)
This note was copied from a baby's chart. Lactation Consultation Note  Patient Name: Leah Morris ZOXWR'UToday's Date: 02/22/2016 Reason for consult: Follow-up assessment   Baby 215 days old. Mom packed up and ready for discharge. [facility] and [facility], which was given. Mom reports that her breasts have been heavy and somewhat hard. Discussed engorgement prevention/treatment, and how to protect milk supply. Mom referred to Baby and Me booklet for number of diapers to expect by day of life and EBM storage guidelines. Mom aware of OP/BFSG and LC phone line assistance after D/C.   Maternal Data Formula Feeding for Exclusion: No  Feeding Feeding Type: Bottle Fed - Breast Milk Nipple Type: Slow - flow  LATCH Score/Interventions                      Lactation Tools Discussed/Used     Consult Status Consult Status: PRN    Sherlyn HayJennifer D Kyani Simkin 02/22/2016, 10:19 AM

## 2016-02-25 NOTE — Telephone Encounter (Signed)
Patient has never call back. She had appointment on 02/21/2016 and cancel

## 2016-03-26 ENCOUNTER — Encounter: Payer: Self-pay | Admitting: *Deleted

## 2016-03-26 ENCOUNTER — Ambulatory Visit: Payer: Medicaid Other | Admitting: Advanced Practice Midwife

## 2016-03-26 NOTE — Progress Notes (Signed)
Leah Morris missed her postpartum appointment . I will send her a letter.

## 2016-04-01 ENCOUNTER — Ambulatory Visit: Payer: Medicaid Other | Admitting: Advanced Practice Midwife

## 2016-04-09 ENCOUNTER — Ambulatory Visit (INDEPENDENT_AMBULATORY_CARE_PROVIDER_SITE_OTHER): Payer: Medicaid Other | Admitting: Obstetrics & Gynecology

## 2016-04-09 ENCOUNTER — Encounter: Payer: Self-pay | Admitting: Obstetrics & Gynecology

## 2016-04-09 ENCOUNTER — Other Ambulatory Visit (HOSPITAL_COMMUNITY)
Admission: RE | Admit: 2016-04-09 | Discharge: 2016-04-09 | Disposition: A | Discharge status: Home / Self Care | Payer: Medicaid Other | Source: Ambulatory Visit | Attending: Obstetrics & Gynecology | Admitting: Obstetrics & Gynecology

## 2016-04-09 DIAGNOSIS — Z113 Encounter for screening for infections with a predominantly sexual mode of transmission: Secondary | ICD-10-CM | POA: Diagnosis present

## 2016-04-09 DIAGNOSIS — Z308 Encounter for other contraceptive management: Secondary | ICD-10-CM

## 2016-04-09 DIAGNOSIS — Z3202 Encounter for pregnancy test, result negative: Secondary | ICD-10-CM

## 2016-04-09 LAB — POCT PREGNANCY, URINE: Preg Test, Ur: NEGATIVE

## 2016-04-09 MED ORDER — ETONOGESTREL-ETHINYL ESTRADIOL 0.12-0.015 MG/24HR VA RING: VAGINAL_RING | VAGINAL | 12 refills | Status: DC

## 2016-04-09 NOTE — Patient Instructions (Signed)
Sexually Transmitted Disease °A sexually transmitted disease (STD) is a disease or infection that may be passed (transmitted) from person to person, usually during sexual activity. This may happen by way of saliva, semen, blood, vaginal mucus, or urine. Common STDs include: °· Gonorrhea. °· Chlamydia. °· Syphilis. °· HIV and AIDS. °· Genital herpes. °· Hepatitis B and C. °· Trichomonas. °· Human papillomavirus (HPV). °· Pubic lice. °· Scabies. °· Mites. °· Bacterial vaginosis. °WHAT ARE CAUSES OF STDs? °An STD may be caused by bacteria, a virus, or parasites. STDs are often transmitted during sexual activity if one person is infected. However, they may also be transmitted through nonsexual means. STDs may be transmitted after:  °· Sexual intercourse with an infected person. °· Sharing sex toys with an infected person. °· Sharing needles with an infected person or using unclean piercing or tattoo needles. °· Having intimate contact with the genitals, mouth, or rectal areas of an infected person. °· Exposure to infected fluids during birth. °WHAT ARE THE SIGNS AND SYMPTOMS OF STDs? °Different STDs have different symptoms. Some people may not have any symptoms. If symptoms are present, they may include: °· Painful or bloody urination. °· Pain in the pelvis, abdomen, vagina, anus, throat, or eyes. °· A skin rash, itching, or irritation. °· Growths, ulcerations, blisters, or sores in the genital and anal areas. °· Abnormal vaginal discharge with or without bad odor. °· Penile discharge in men. °· Fever. °· Pain or bleeding during sexual intercourse. °· Swollen glands in the groin area. °· Yellow skin and eyes (jaundice). This is seen with hepatitis. °· Swollen testicles. °· Infertility. °· Sores and blisters in the mouth. °HOW ARE STDs DIAGNOSED? °To make a diagnosis, your health care provider may: °· Take a medical history. °· Perform a physical exam. °· Take a sample of any discharge to examine. °· Swab the throat,  cervix, opening to the penis, rectum, or vagina for testing. °· Test a sample of your first morning urine. °· Perform blood tests. °· Perform a Pap test, if this applies. °· Perform a colposcopy. °· Perform a laparoscopy. °HOW ARE STDs TREATED? °Treatment depends on the STD. Some STDs may be treated but not cured. °· Chlamydia, gonorrhea, trichomonas, and syphilis can be cured with antibiotic medicine. °· Genital herpes, hepatitis, and HIV can be treated, but not cured, with prescribed medicines. The medicines lessen symptoms. °· Genital warts from HPV can be treated with medicine or by freezing, burning (electrocautery), or surgery. Warts may come back. °· HPV cannot be cured with medicine or surgery. However, abnormal areas may be removed from the cervix, vagina, or vulva. °· If your diagnosis is confirmed, your recent sexual partners need treatment. This is true even if they are symptom-free or have a negative culture or evaluation. They should not have sex until their health care providers say it is okay. °· Your health care provider may test you for infection again 3 months after treatment. °HOW CAN I REDUCE MY RISK OF GETTING AN STD? °Take these steps to reduce your risk of getting an STD: °· Use latex condoms, dental dams, and water-soluble lubricants during sexual activity. Do not use petroleum jelly or oils. °· Avoid having multiple sex partners. °· Do not have sex with someone who has other sex partners °· Do not have sex with anyone you do not know or who is at high risk for an STD. °· Avoid risky sex practices that can break your skin. °· Do not have sex   if you have open sores on your mouth or skin. °· Avoid drinking too much alcohol or taking illegal drugs. Alcohol and drugs can affect your judgment and put you in a vulnerable position. °· Avoid engaging in oral and anal sex acts. °· Get vaccinated for HPV and hepatitis. If you have not received these vaccines in the past, talk to your health care  provider about whether one or both might be right for you. °· If you are at risk of being infected with HIV, it is recommended that you take a prescription medicine daily to prevent HIV infection. This is called pre-exposure prophylaxis (PrEP). You are considered at risk if: °¨ You are a man who has sex with other men (MSM). °¨ You are a heterosexual man or woman and are sexually active with more than one partner. °¨ You take drugs by injection. °¨ You are sexually active with a partner who has HIV. °· Talk with your health care provider about whether you are at high risk of being infected with HIV. If you choose to begin PrEP, you should first be tested for HIV. You should then be tested every 3 months for as long as you are taking PrEP. °WHAT SHOULD I DO IF I THINK I HAVE AN STD? °· See your health care provider. °· Tell your sexual partner(s). They should be tested and treated for any STDs. °· Do not have sex until your health care provider says it is okay. °WHEN SHOULD I GET IMMEDIATE MEDICAL CARE? °Contact your health care provider right away if:  °· You have severe abdominal pain. °· You are a man and notice swelling or pain in your testicles. °· You are a woman and notice swelling or pain in your vagina. °  °This information is not intended to replace advice given to you by your health care provider. Make sure you discuss any questions you have with your health care provider. °  °Document Released: 08/23/2002 Document Revised: 06/23/2014 Document Reviewed: 12/21/2012 °Elsevier Interactive Patient Education ©2016 Else[employer] Inc. ° °
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
## 2016-04-10 LAB — GC/CHLAMYDIA PROBE AMP (~~LOC~~) NOT AT ARMC
Chlamydia: NEGATIVE
Neisseria Gonorrhea: NEGATIVE

## 2016-04-10 LAB — HIV ANTIBODY (ROUTINE TESTING W REFLEX): HIV 1&2 Ab, 4th Generation: NONREACTIVE

## 2016-04-10 LAB — RPR

## 2016-04-10 LAB — HEPATITIS B SURFACE ANTIGEN: Hepatitis B Surface Ag: NEGATIVE

## 2016-04-10 LAB — HEPATITIS C ANTIBODY: HCV AB: NEGATIVE

## 2016-04-15 ENCOUNTER — Telehealth: Payer: Self-pay | Admitting: *Deleted

## 2016-04-15 DIAGNOSIS — B373 Candidiasis of vulva and vagina: Secondary | ICD-10-CM

## 2016-04-15 DIAGNOSIS — B3731 Acute candidiasis of vulva and vagina: Secondary | ICD-10-CM

## 2016-04-15 NOTE — Telephone Encounter (Signed)
Pt left message yesterday requesting test results.

## 2016-04-16 MED ORDER — FLUCONAZOLE 150 MG PO TABS: 150.0000 mg | ORAL_TABLET | Freq: Once | ORAL | 0 refills | Status: AC

## 2016-04-16 NOTE — Telephone Encounter (Signed)
Pt called requesting results of std testing. Advise patient that all test are negative. She reports itching and white vaginal discharge. Rx to pharmacy for diflucan.

## 2016-05-01 ENCOUNTER — Ambulatory Visit (HOSPITAL_COMMUNITY)
Admission: EM | Admit: 2016-05-01 | Discharge: 2016-05-01 | Disposition: A | Discharge status: Home / Self Care | Payer: Medicaid Other | Attending: Emergency Medicine | Admitting: Emergency Medicine

## 2016-05-01 ENCOUNTER — Telehealth: Payer: Self-pay | Admitting: General Practice

## 2016-05-01 ENCOUNTER — Encounter (HOSPITAL_COMMUNITY): Payer: Self-pay | Admitting: Emergency Medicine

## 2016-05-01 DIAGNOSIS — B9689 Other specified bacterial agents as the cause of diseases classified elsewhere: Secondary | ICD-10-CM

## 2016-05-01 DIAGNOSIS — J00 Acute nasopharyngitis [common cold]: Secondary | ICD-10-CM

## 2016-05-01 DIAGNOSIS — N760 Acute vaginitis: Principal | ICD-10-CM

## 2016-05-01 NOTE — Telephone Encounter (Signed)
Patient called and left message stating she was recently treated here for an STD but feels like she may have yeast or a bacterial infection and would like a call back

## 2016-05-01 NOTE — Discharge Instructions (Signed)
Sudafed PE 10 mg every 4 to 6 hours as needed for congestion °Allegra or Zyrtec daily as needed for drainage and runny nose. °For stronger antihistamine may take Chlor-Trimeton 2 to 4 mg every 4 to 6 hours, may cause drowsiness. °Saline nasal spray used frequently. °Ibuprofen 600 mg every 6 hours as needed for pain, discomfort or fever. °Drink plenty of fluids and stay well-hydrated. °Flonase or Rhinocort nasal spray daily °

## 2016-05-01 NOTE — ED Triage Notes (Addendum)
[facility] with a complaint of a sore throat x 2 days. The patient denied fever.

## 2016-05-01 NOTE — ED Provider Notes (Signed)
CSN: [id number]     Arrival date & time 05/01/16  1401 History   First MD Initiated Contact with Patient 05/01/16 [phone]     Chief Complaint  Patient presents with  . Sore Throat   (Consider location/radiation/quality/duration/timing/severity/associated sxs/prior Treatment) 25 year old female states that she has PND, runny nose, sore throat, PND and a cough for the past 4-5 days. She feels like she has a long in the lower mid throat. Denies fever or chills or shortness of breath. She took a dose of OTC medication, it did not work she stopped taking it.      Past Medical History:  Diagnosis Date  . Medical history non-contributory    Past Surgical History:  Procedure Laterality Date  . DILATION AND CURETTAGE OF UTERUS  2010   History reviewed. No pertinent family history. Social History  Substance Use Topics  . Smoking status: Never Smoker  . Smokeless tobacco: Never Used  . Alcohol use No   OB History    Gravida Para Term Preterm AB Living   6 2 2  0 4 2   SAB TAB Ectopic Multiple Live Births   2 2 0 0 2     Review of Systems  Constitutional: Negative for activity change, appetite change, chills, fatigue and fever.  HENT: Positive for congestion, postnasal drip, rhinorrhea and sore throat. Negative for facial swelling.   Eyes: Negative.   Respiratory: Positive for cough. Negative for apnea and shortness of breath.   Cardiovascular: Negative.   Gastrointestinal: Negative.   Musculoskeletal: Negative for neck pain and neck stiffness.  Skin: Negative for pallor and rash.  Neurological: Negative.   All other systems reviewed and are negative.   Allergies  Patient has no known allergies.  Home Medications   Prior to Admission medications   Medication Sig Start Date End Date Taking? Authorizing Provider  etonogestrel-ethinyl estradiol (NUVARING) 0.12-0.015 MG/24HR vaginal ring Insert vaginally and leave in place for 3 consecutive weeks, then remove for 1 week. 04/09/16    Willodean Rosenthalarolyn Harraway-Wiggs, MD   Meds Ordered and Administered this Visit  Medications - No data to display  BP 115/56 (BP Location: Left Arm)   Pulse 78   Temp 98.2 F (36.8 C) (Oral)   Resp 18   LMP 04/16/2016 (Exact Date)   SpO2 100%   Breastfeeding? No  No data found.   Physical Exam  Constitutional: She is oriented to person, place, and time. She appears well-developed and well-nourished. No distress.  HENT:  Head: Normocephalic and atraumatic.  Right Ear: External ear normal.  Left Ear: External ear normal.  Bilateral TMs are normal. Oropharynx with light clear PND. No exudates.  Eyes: EOM are normal. Pupils are equal, round, and reactive to light.  Neck: Normal range of motion. Neck supple.  Cardiovascular: Normal rate, regular rhythm and normal heart sounds.   Pulmonary/Chest: Effort normal and breath sounds normal. No respiratory distress. She has no wheezes.  Musculoskeletal: Normal range of motion. She exhibits no edema.  Lymphadenopathy:    She has no cervical adenopathy.  Neurological: She is alert and oriented to person, place, and time.  Skin: Skin is warm and dry.  Psychiatric: She has a normal mood and affect.  Nursing note and vitals reviewed.   Urgent Care Course   Clinical Course     Procedures (including critical care time)  Labs Review Labs Reviewed - No data to display  Imaging Review No results found.   Visual Acuity Review  Right Eye Distance:  Left Eye Distance:   Bilateral Distance:    Right Eye Near:   Left Eye Near:    Bilateral Near:         MDM   1. Acute nasopharyngitis    Sudafed PE 10 mg every 4 to 6 hours as needed for congestion Allegra or Zyrtec daily as needed for drainage and runny nose. For stronger antihistamine may take Chlor-Trimeton 2 to 4 mg every 4 to 6 hours, may cause drowsiness. Saline nasal spray used frequently. Ibuprofen 600 mg every 6 hours as needed for pain, discomfort or fever. Drink  plenty of fluids and stay well-hydrated. Flonase or Rhinocort nasal spray daily     Hayden Rasmussenavid Kenyatte Gruber, NP 05/01/16 1644

## 2016-05-13 MED ORDER — METRONIDAZOLE 500 MG PO TABS: 500.0000 mg | ORAL_TABLET | Freq: Two times a day (BID) | ORAL | 0 refills | Status: DC

## 2016-05-13 NOTE — Telephone Encounter (Signed)
I called [name] back and she reports she is having symptoms of bv including discharge and bad odor. States she has had BV before and knows for a fact this is BV. Per chart last treated 6 1/2 months ago. Will send in flagyl to her pharmacy. Instructed her to call and make appointment if this does not relieve her symptoms. She voices understanding.

## 2016-05-14 ENCOUNTER — Telehealth: Payer: Self-pay | Admitting: *Deleted

## 2016-05-15 NOTE — Telephone Encounter (Signed)
error 

## 2016-07-15 ENCOUNTER — Emergency Department (HOSPITAL_BASED_OUTPATIENT_CLINIC_OR_DEPARTMENT_OTHER)
Admission: EM | Admit: 2016-07-15 | Discharge: 2016-07-15 | Disposition: A | Discharge status: Home / Self Care | Payer: Medicaid Other | Attending: Emergency Medicine | Admitting: Emergency Medicine

## 2016-07-15 ENCOUNTER — Encounter (HOSPITAL_BASED_OUTPATIENT_CLINIC_OR_DEPARTMENT_OTHER): Payer: Self-pay | Admitting: *Deleted

## 2016-07-15 DIAGNOSIS — N72 Inflammatory disease of cervix uteri: Secondary | ICD-10-CM

## 2016-07-15 DIAGNOSIS — O26891 Other specified pregnancy related conditions, first trimester: Secondary | ICD-10-CM | POA: Diagnosis present

## 2016-07-15 DIAGNOSIS — Z3490 Encounter for supervision of normal pregnancy, unspecified, unspecified trimester: Secondary | ICD-10-CM

## 2016-07-15 DIAGNOSIS — N898 Other specified noninflammatory disorders of vagina: Secondary | ICD-10-CM | POA: Diagnosis not present

## 2016-07-15 DIAGNOSIS — O23511 Infections of cervix in pregnancy, first trimester: Secondary | ICD-10-CM | POA: Diagnosis not present

## 2016-07-15 DIAGNOSIS — Z3A08 8 weeks gestation of pregnancy: Secondary | ICD-10-CM | POA: Insufficient documentation

## 2016-07-15 LAB — URINALYSIS, MICROSCOPIC (REFLEX)

## 2016-07-15 LAB — URINALYSIS, ROUTINE W REFLEX MICROSCOPIC
Bilirubin Urine: NEGATIVE
Glucose, UA: NEGATIVE mg/dL
HGB URINE DIPSTICK: NEGATIVE
Ketones, ur: NEGATIVE mg/dL
Nitrite: NEGATIVE
PROTEIN: NEGATIVE mg/dL
SPECIFIC GRAVITY, URINE: 1.027 (ref 1.005–1.030)
pH: 7.5 (ref 5.0–8.0)

## 2016-07-15 LAB — PREGNANCY, URINE: PREG TEST UR: POSITIVE — AB

## 2016-07-15 LAB — WET PREP, GENITAL
Clue Cells Wet Prep HPF POC: NONE SEEN
SPERM: NONE SEEN
Trich, Wet Prep: NONE SEEN
Yeast Wet Prep HPF POC: NONE SEEN

## 2016-07-15 MED ORDER — AZITHROMYCIN 250 MG PO TABS
1000.0000 mg | ORAL_TABLET | Freq: Once | ORAL | Status: AC
  Administered 2016-07-15: 1000 mg via ORAL
  Filled 2016-07-15: qty 4

## 2016-07-15 MED ORDER — LIDOCAINE HCL (PF) 1 % IJ SOLN
INTRAMUSCULAR | Status: AC
  Administered 2016-07-15: 5 mL
  Filled 2016-07-15: qty 5

## 2016-07-15 MED ORDER — ONDANSETRON 8 MG PO TBDP
8.0000 mg | ORAL_TABLET | Freq: Once | ORAL | Status: AC
Start: 2016-07-15 — End: 2016-07-15
  Administered 2016-07-15: 8 mg via ORAL
  Filled 2016-07-15: qty 1

## 2016-07-15 MED ORDER — CEFTRIAXONE SODIUM 250 MG IJ SOLR
250.0000 mg | Freq: Once | INTRAMUSCULAR | Status: AC
Start: 2016-07-15 — End: 2016-07-15
  Administered 2016-07-15: 250 mg via INTRAMUSCULAR
  Filled 2016-07-15: qty 250

## 2016-07-15 NOTE — ED Triage Notes (Addendum)
Abdominal pain. She has a vaginal discharge and wants STD testing and a pregnancy test. She has had a positive home pregnancy test. Has not been able to get prenatal care because all doctors offices will not allow children in the room during exams and she does not have child care for her 2 children.

## 2016-07-15 NOTE — ED Provider Notes (Signed)
[facility]   CSN: [id number] Arrival date & time: 07/15/16  1322     History   Chief Complaint Chief Complaint  Patient presents with  . Abdominal Pain    HPI Leah Morris is a 26 y.o. female.  HPI Leah Morris is a 26 y.o. female presents to emergency department complaining of vaginal discharge and positive pregnancy test at home. Patient states her last menstrual cycle was in early November, she states it was her first cycle after delivering on 02/18/2016. She states she hasn't been able to see an OB/GYN because unable to get anyone to babysit her children. She states she started having a yellow vaginal discharge several days ago. She states she is married with one sexual partner. Her husband is here as well to get tested. Patient denies any abdominal pain. No vaginal bleeding. No back pain. No urinary symptoms.  Past Medical History:  Diagnosis Date  . Medical history non-contributory     Patient Active Problem List   Diagnosis Date Noted  . Status post normal vaginal delivery 02/18/2016  . Supervision of low-risk pregnancy 10/04/2015    Past Surgical History:  Procedure Laterality Date  . DILATION AND CURETTAGE OF UTERUS  2010    OB History    Gravida Para Term Preterm AB Living   6 2 2  0 4 2   SAB TAB Ectopic Multiple Live Births   2 2 0 0 2       Home Medications    Prior to Admission medications   Medication Sig Start Date End Date Taking? Authorizing Provider  etonogestrel-ethinyl estradiol (NUVARING) 0.12-0.015 MG/24HR vaginal ring Insert vaginally and leave in place for 3 consecutive weeks, then remove for 1 week. 04/09/16   Willodean Rosenthalarolyn Harraway-Whittinghill, MD  metroNIDAZOLE (FLAGYL) 500 MG tablet Take 1 tablet (500 mg total) by mouth 2 (two) times daily. 05/13/16   Reva Boresanya S Pratt, MD    Family History No family history on file.  Social History Social History  Substance Use Topics  . Smoking status: Never Smoker  . Smokeless  tobacco: Never Used  . Alcohol use No     Allergies   Patient has no known allergies.   Review of Systems Review of Systems  Constitutional: Negative for chills and fever.  Respiratory: Negative for cough, chest tightness and shortness of breath.   Cardiovascular: Negative for chest pain, palpitations and leg swelling.  Gastrointestinal: Negative for abdominal pain, diarrhea, nausea and vomiting.  Genitourinary: Positive for vaginal discharge. Negative for dysuria, flank pain, pelvic pain, vaginal bleeding and vaginal pain.  Musculoskeletal: Negative for arthralgias, myalgias, neck pain and neck stiffness.  Skin: Negative for rash.  Neurological: Negative for dizziness, weakness and headaches.  All other systems reviewed and are negative.    Physical Exam Updated Vital Signs BP 135/67 (BP Location: Right Arm)   Pulse 90   Temp 98 F (36.7 C) (Oral)   Resp 18   Ht 5\' 4"  (1.626 m)   Wt 77.1 kg   LMP 04/16/2016   SpO2 100%   BMI 29.18 kg/m   Physical Exam  Constitutional: She appears well-developed and well-nourished. No distress.  HENT:  Head: Normocephalic.  Eyes: Conjunctivae are normal.  Neck: Neck supple.  Cardiovascular: Normal rate, regular rhythm and normal heart sounds.   Pulmonary/Chest: Effort normal and breath sounds normal. No respiratory distress. She has no wheezes. She has no rales.  Abdominal: Soft. Bowel sounds are normal. She exhibits no distension. There is  no tenderness. There is no rebound.  Genitourinary:  Genitourinary Comments: Normal external genitalia. Normal vaginal canal with a yellow thick discharge. Cervix is normal. No cervical motion tenderness. Uterus is gravid, no bilateral adnexal tenderness.  Musculoskeletal: She exhibits no edema.  Neurological: She is alert.  Skin: Skin is warm and dry.  Psychiatric: She has a normal mood and affect. Her behavior is normal.  Nursing note and vitals reviewed.    ED Treatments / Results   Labs (all labs ordered are listed, but only abnormal results are displayed) Labs Reviewed  WET PREP, GENITAL - Abnormal; Notable for the following:       Result Value   WBC, Wet Prep HPF POC MANY (*)    All other components within normal limits  URINALYSIS, ROUTINE W REFLEX MICROSCOPIC - Abnormal; Notable for the following:    APPearance CLOUDY (*)    Leukocytes, UA TRACE (*)    All other components within normal limits  PREGNANCY, URINE - Abnormal; Notable for the following:    Preg Test, Ur POSITIVE (*)    All other components within normal limits  URINALYSIS, MICROSCOPIC (REFLEX) - Abnormal; Notable for the following:    Bacteria, UA FEW (*)    Squamous Epithelial / LPF 6-30 (*)    All other components within normal limits  RPR  HIV ANTIBODY (ROUTINE TESTING)  GC/CHLAMYDIA PROBE AMP ([place]) NOT AT [facility]    EKG  EKG Interpretation None       Radiology No results found.  Procedures Procedures (including critical care time)  Medications Ordered in ED Medications  cefTRIAXone (ROCEPHIN) injection 250 mg (250 mg Intramuscular Given 07/15/16 1535)  azithromycin (ZITHROMAX) tablet 1,000 mg (1,000 mg Oral Given 07/15/16 1536)  ondansetron (ZOFRAN-ODT) disintegrating tablet 8 mg (8 mg Oral Given 07/15/16 1535)  lidocaine (PF) (XYLOCAINE) 1 % injection (5 mLs  Given 07/15/16 1537)     Initial Impression / Assessment and Plan / ED Course  I have reviewed the triage vital signs and the nursing notes.  Pertinent labs & imaging results that were available during my care of the patient were reviewed by me and considered in my medical decision making (see chart for details).    Pt in ED with vaginal discharge. Exam and wet prep concerning for cervicitis. Treated with rocephin 250mg  IM and zithromax 1g po. Cultures pending. Pt's pregnancy test positive. Last menustrual period nov 3rd, which approximates her at 12 wks at this time. Pt has no abdominal pain, no vaginal bleeding,  low concern for ectopic pregnancy. Do not think she needs any imaging at this time. Will dc home with close outpatient follow up. Return precautions discussed. Pt instructed to start prenatal vitamins and tylenol for pain.   Vitals:   07/15/16 1334 07/15/16 1336 07/15/16 1538  BP:  117/69 135/67  Pulse:  71 90  Resp:  20 18  Temp:  98 F (36.7 C)   TempSrc:  Oral   SpO2:  100% 100%  Weight: 77.1 kg    Height: 5\' 4"  (1.626 m)       Final Clinical Impressions(s) / ED Diagnoses   Final diagnoses:  Cervicitis  Pregnancy, unspecified gestational age    [place] Prescriptions New Prescriptions   No medications on file     Jaynie Crumble, PA-C 07/15/16 1555    Jerelyn Scott, MD 07/15/16 [phone]

## 2016-07-15 NOTE — Discharge Instructions (Signed)
You were treated today for possible gonorrhea and Chlamydia infection. Your tests are pending and if they come back abnormal you will be called. Please follow-up with OB/GYN regarding your pregnancy. Return if any worsening symptom, abdominal pain, vaginal bleeding, any new concerning symptoms.

## 2016-07-16 LAB — HIV ANTIBODY (ROUTINE TESTING W REFLEX): HIV SCREEN 4TH GENERATION: NONREACTIVE

## 2016-07-16 LAB — GC/CHLAMYDIA PROBE AMP (~~LOC~~) NOT AT ARMC
CHLAMYDIA, DNA PROBE: NEGATIVE
Neisseria Gonorrhea: NEGATIVE

## 2016-07-16 LAB — RPR: RPR: NONREACTIVE

## 2016-08-26 ENCOUNTER — Ambulatory Visit (INDEPENDENT_AMBULATORY_CARE_PROVIDER_SITE_OTHER): Payer: Medicaid Other | Admitting: Certified Nurse Midwife

## 2016-08-26 ENCOUNTER — Encounter: Payer: Self-pay | Admitting: Certified Nurse Midwife

## 2016-08-26 ENCOUNTER — Other Ambulatory Visit (HOSPITAL_COMMUNITY)
Admission: RE | Admit: 2016-08-26 | Discharge: 2016-08-26 | Disposition: A | Discharge status: Home / Self Care | Payer: Medicaid Other | Source: Ambulatory Visit | Attending: Certified Nurse Midwife | Admitting: Certified Nurse Midwife

## 2016-08-26 VITALS — BP 111/62 | HR 71 | Wt 162.6 lb

## 2016-08-26 DIAGNOSIS — O219 Vomiting of pregnancy, unspecified: Secondary | ICD-10-CM

## 2016-08-26 DIAGNOSIS — Z3490 Encounter for supervision of normal pregnancy, unspecified, unspecified trimester: Secondary | ICD-10-CM | POA: Insufficient documentation

## 2016-08-26 DIAGNOSIS — Z3480 Encounter for supervision of other normal pregnancy, unspecified trimester: Secondary | ICD-10-CM | POA: Diagnosis present

## 2016-08-26 DIAGNOSIS — O0932 Supervision of pregnancy with insufficient antenatal care, second trimester: Secondary | ICD-10-CM

## 2016-08-26 DIAGNOSIS — O09A0 Supervision of pregnancy with history of molar pregnancy, unspecified trimester: Secondary | ICD-10-CM

## 2016-08-26 DIAGNOSIS — O0930 Supervision of pregnancy with insufficient antenatal care, unspecified trimester: Secondary | ICD-10-CM

## 2016-08-26 DIAGNOSIS — O09A2 Supervision of pregnancy with history of molar pregnancy, second trimester: Secondary | ICD-10-CM

## 2016-08-26 MED ORDER — VITAFOL GUMMIES 3.33-0.333-34.8 MG PO CHEW: 3.0000 | CHEWABLE_TABLET | Freq: Every day | ORAL | 12 refills | Status: DC

## 2016-08-26 MED ORDER — ONDANSETRON HCL 8 MG PO TABS: 8.0000 mg | ORAL_TABLET | Freq: Three times a day (TID) | ORAL | 2 refills | Status: DC | PRN

## 2016-08-26 NOTE — Progress Notes (Signed)
Subjective:    Leah Morris is being seen today for her first obstetrical visit.  This is not a planned pregnancy. She is at 4328w2d gestation. Her obstetrical history is significant for molar pregnancy 2010, close spaced pregnancies. Relationship with FOB: spouse, not living together. Patient does intend to breast feed. Pregnancy history fully reviewed.  The information documented in the HPI was reviewed and verified.  Menstrual History: OB History    Gravida Para Term Preterm AB Living   6 2 2  0 3 2   SAB TAB Ectopic Multiple Live Births   2 1 0 0 2       Patient's last menstrual period was 03/16/2016 (approximate).    Past Medical History:  Diagnosis Date  . Medical history non-contributory     Past Surgical History:  Procedure Laterality Date  . DILATION AND CURETTAGE OF UTERUS  2010     (Not in a hospital admission) No Known Allergies  Social History  Substance Use Topics  . Smoking status: Never Smoker  . Smokeless tobacco: Never Used  . Alcohol use No    History reviewed. No pertinent family history.   Review of Systems Constitutional: negative for weight loss Gastrointestinal: negative for vomiting Genitourinary:negative for genital lesions and vaginal discharge and dysuria Musculoskeletal:negative for back pain Behavioral/Psych: negative for abusive relationship, depression, illegal drug usage and tobacco use    Objective:    BP 111/62   Pulse 71   Wt 162 lb 9.6 oz (73.8 kg)   LMP 03/16/2016 (Approximate)   BMI 27.91 kg/m  General Appearance:    Alert, cooperative, no distress, appears stated age  Head:    Normocephalic, without obvious abnormality, atraumatic  Eyes:    PERRL, conjunctiva/corneas clear, EOM's intact, fundi    benign, both eyes  Ears:    Normal TM's and external ear canals, both ears  Nose:   Nares normal, septum midline, mucosa normal, no drainage    or sinus tenderness  Throat:   Lips, mucosa, and tongue normal; teeth and gums normal   Neck:   Supple, symmetrical, trachea midline, no adenopathy;    thyroid:  no enlargement/tenderness/nodules; no carotid   bruit or JVD  Back:     Symmetric, no curvature, ROM normal, no CVA tenderness  Lungs:     Clear to auscultation bilaterally, respirations unlabored  Chest Wall:    No tenderness or deformity   Heart:    Regular rate and rhythm, S1 and S2 normal, no murmur, rub   or gallop  Breast Exam:    No tenderness, masses, or nipple abnormality  Abdomen:     Soft, non-tender, bowel sounds active all four quadrants,    no masses, no organomegaly  Genitalia:    Normal female without lesion, discharge or tenderness  Extremities:   Extremities normal, atraumatic, no cyanosis or edema  Pulses:   2+ and symmetric all extremities  Skin:   Skin color, texture, turgor normal, no rashes or lesions  Lymph nodes:   Cervical, supraclavicular, and axillary nodes normal  Neurologic:   CNII-XII intact, normal strength, sensation and reflexes    throughout           Cervix: long, thick, closed and posterior.  FHR: 135 by doppler.  +FM.  FH: 23 cm.  Lab Review Urine pregnancy test Labs reviewed yes Radiologic studies reviewed no Assessment:    Pregnancy at 2528w2d weeks     Supervision of other normal pregnancy, antepartum - Plan: TSH, Hemoglobinopathy evaluation,  Varicella zoster antibody, IgG, Culture, OB Urine, MaterniT21 PLUS Core+SCA, Hemoglobin A1c, Obstetric Panel, Including HIV, Cystic Fibrosis Mutation 97, [place] MFM OB COMP + 14 WK, Prenatal Vit-Fe Phos-FA-Omega (VITAFOL GUMMIES) 3.33-0.333-34.8 MG CHEW  Late prenatal care - Plan: MaterniT21 PLUS Core+SCA, [place] MFM OB COMP + 14 WK  Nausea and vomiting during pregnancy - Plan: ondansetron (ZOFRAN) 8 MG tablet    Plan:     Declines counseling or SW intervention.   Prenatal vitamins.  Counseling provided regarding continued use of seat belts, cessation of alcohol consumption, smoking or use of illicit drugs; infection precautions  i.e., influenza/TDAP immunizations, toxoplasmosis,CMV, parvovirus, listeria and varicella; workplace safety, exercise during pregnancy; routine dental care, safe medications, sexual activity, hot tubs, saunas, pools, travel, caffeine use, fish and methlymercury, potential toxins, hair treatments, varicose veins Weight gain recommendations per IOM guidelines reviewed: underweight/BMI< 18.5--> gain 28 - 40 lbs; normal weight/BMI 18.5 - 24.9--> gain 25 - 35 lbs; overweight/BMI 25 - 29.9--> gain 15 - 25 lbs; obese/BMI >30->gain  11 - 20 lbs Problem list reviewed and updated. FIRST/CF mutation testing/NIPT/QUAD SCREEN/fragile X/Ashkenazi Jewish population testing/Spinal muscular atrophy discussed: ordered. Role of ultrasound in pregnancy discussed; fetal survey: ordered. Amniocentesis discussed: not indicated. VBAC calculator score: VBAC consent form provided Meds ordered this encounter  Medications  . ondansetron (ZOFRAN) 8 MG tablet    Sig: Take 1 tablet (8 mg total) by mouth every 8 (eight) hours as needed for nausea or vomiting.    Dispense:  40 tablet    Refill:  2  . Prenatal Vit-Fe Phos-FA-Omega (VITAFOL GUMMIES) 3.33-0.333-34.8 MG CHEW    Sig: Chew 3 tablets by mouth at bedtime.    Dispense:  90 tablet    Refill:  12   Orders Placed This Encounter  Procedures  . Culture, OB Urine  . [place] MFM OB COMP + 14 WK    Standing Status:   Future    Standing Expiration Date:   10/26/2017    Order Specific Question:   Reason for Exam (SYMPTOM  OR DIAGNOSIS REQUIRED)    Answer:   fetal anatomy scan    Order Specific Question:   Preferred imaging location?    Answer:   [facility]-Ultrasound  . TSH  . Hemoglobinopathy evaluation  . Varicella zoster antibody, IgG  . MaterniT21 PLUS Core+SCA    Order Specific Question:   Is the patient insulin dependent?    Answer:   No    Order Specific Question:   Please enter gestational age. This should be expressed as weeks AND days, i.e. 16w 6d. Enter weeks here.  Enter days in next question.    Answer:   35    Order Specific Question:   Please enter gestational age. This should be expressed as weeks AND days, i.e. 16w 6d. Enter days here. Enter weeks in previous question.    Answer:   2    Order Specific Question:   How was gestational age calculated?    Answer:   LMP    Order Specific Question:   Please give the date of LMP OR Ultrasound OR Estimated date of delivery.    Answer:   12/21/2016    Order Specific Question:   Number of Fetuses (Type of Pregnancy):    Answer:   1    Order Specific Question:   Indications for performing the test? (please choose all that apply):    Answer:   Routine screening    Order Specific Question:   Other Indications? (  Y=Yes, N=No)    Answer:   N    Order Specific Question:   If this is a repeat specimen, please indicate the reason:    Answer:   Not indicated    Order Specific Question:   Please specify the patient's race: (C=White/Caucasion, B=Black, I=Native American, A=Asian, H=Hispanic, O=Other, U=Unknown)    Answer:   B    Order Specific Question:   Donor Egg - indicate if the egg was obtained from in vitro fertilization.    Answer:   N    Order Specific Question:   Age of Egg Donor.    Answer:   48    Order Specific Question:   Prior Down Syndrome/ONTD screening during current pregnancy.    Answer:   N    Order Specific Question:   Prior First Trimester Testing    Answer:   N    Order Specific Question:   Prior Second Trimester Testing    Answer:   N    Order Specific Question:   Family History of Neural Tube Defects    Answer:   N    Order Specific Question:   Prior Pregnancy with Down Syndrome    Answer:   N    Order Specific Question:   Please give the patient's weight (in pounds)    Answer:   163  . Hemoglobin A1c  . Obstetric Panel, Including HIV  . Cystic Fibrosis Mutation 97    Follow up in 2 weeks. 50% of 30 min visit spent on counseling and coordination of care.

## 2016-08-27 LAB — CERVICOVAGINAL ANCILLARY ONLY
BACTERIAL VAGINITIS: NEGATIVE
CHLAMYDIA, DNA PROBE: NEGATIVE
Candida vaginitis: NEGATIVE
NEISSERIA GONORRHEA: NEGATIVE
Trichomonas: NEGATIVE

## 2016-08-29 LAB — OBSTETRIC PANEL, INCLUDING HIV
Antibody Screen: NEGATIVE
BASOS ABS: 0 10*3/uL (ref 0.0–0.2)
Basos: 0 %
EOS (ABSOLUTE): 0.1 10*3/uL (ref 0.0–0.4)
Eos: 1 %
HIV Screen 4th Generation wRfx: NONREACTIVE
Hematocrit: 37.9 % (ref 34.0–46.6)
Hemoglobin: 12.9 g/dL (ref 11.1–15.9)
Hepatitis B Surface Ag: NEGATIVE
IMMATURE GRANULOCYTES: 0 %
Immature Grans (Abs): 0 10*3/uL (ref 0.0–0.1)
LYMPHS ABS: 2.7 10*3/uL (ref 0.7–3.1)
Lymphs: 29 %
MCH: 32.3 pg (ref 26.6–33.0)
MCHC: 34 g/dL (ref 31.5–35.7)
MCV: 95 fL (ref 79–97)
MONOS ABS: 0.6 10*3/uL (ref 0.1–0.9)
Monocytes: 6 %
NEUTROS PCT: 64 %
Neutrophils Absolute: 6 10*3/uL (ref 1.4–7.0)
PLATELETS: 251 10*3/uL (ref 150–379)
RBC: 3.99 x10E6/uL (ref 3.77–5.28)
RDW: 14.2 % (ref 12.3–15.4)
RPR Ser Ql: NONREACTIVE
Rh Factor: POSITIVE
Rubella Antibodies, IGG: 4.81 index (ref >0.99)
WBC: 9.4 10*3/uL (ref 3.4–10.8)

## 2016-08-29 LAB — VARICELLA ZOSTER ANTIBODY, IGG: VARICELLA: 302 {index} (ref >165)

## 2016-08-29 LAB — HEMOGLOBINOPATHY EVALUATION
HEMOGLOBIN A2 QUANTITATION: 2.8 % (ref 1.8–3.2)
HGB C: 0 %
HGB S: 0 %
HGB VARIANT: 0 %
Hemoglobin F Quantitation: 0 % (ref 0.0–2.0)
Hgb A: 97.2 % (ref 96.4–98.8)

## 2016-08-29 LAB — HEMOGLOBIN A1C
Est. average glucose Bld gHb Est-mCnc: 91 mg/dL
Hgb A1c MFr Bld: 4.8 % (ref 4.8–5.6)

## 2016-08-29 LAB — TSH: TSH: 1.36 u[IU]/mL (ref 0.450–4.500)

## 2016-08-30 LAB — CULTURE, OB URINE

## 2016-08-30 LAB — URINE CULTURE, OB REFLEX

## 2016-09-01 ENCOUNTER — Other Ambulatory Visit: Payer: Self-pay | Admitting: Certified Nurse Midwife

## 2016-09-01 DIAGNOSIS — Z3480 Encounter for supervision of other normal pregnancy, unspecified trimester: Secondary | ICD-10-CM

## 2016-09-01 LAB — MATERNIT21 PLUS CORE+SCA
CHROMOSOME 18: NEGATIVE
Chromosome 13: NEGATIVE
Chromosome 21: NEGATIVE
Y Chromosome: DETECTED

## 2016-09-01 LAB — CYSTIC FIBROSIS MUTATION 97: GENE DIS ANAL CARRIER INTERP BLD/T-IMP: NOT DETECTED

## 2016-09-04 ENCOUNTER — Ambulatory Visit (HOSPITAL_COMMUNITY)
Admission: RE | Admit: 2016-09-04 | Discharge: 2016-09-04 | Disposition: A | Discharge status: Home / Self Care | Payer: Medicaid Other | Source: Ambulatory Visit | Attending: Certified Nurse Midwife | Admitting: Certified Nurse Midwife

## 2016-09-04 ENCOUNTER — Other Ambulatory Visit: Payer: Self-pay | Admitting: Certified Nurse Midwife

## 2016-09-04 DIAGNOSIS — Z3480 Encounter for supervision of other normal pregnancy, unspecified trimester: Secondary | ICD-10-CM

## 2016-09-04 DIAGNOSIS — O0930 Supervision of pregnancy with insufficient antenatal care, unspecified trimester: Secondary | ICD-10-CM

## 2016-09-04 DIAGNOSIS — O09892 Supervision of other high risk pregnancies, second trimester: Secondary | ICD-10-CM

## 2016-09-04 DIAGNOSIS — Z363 Encounter for antenatal screening for malformations: Secondary | ICD-10-CM

## 2016-09-04 DIAGNOSIS — O0932 Supervision of pregnancy with insufficient antenatal care, second trimester: Secondary | ICD-10-CM | POA: Diagnosis not present

## 2016-09-04 DIAGNOSIS — Z3A24 24 weeks gestation of pregnancy: Secondary | ICD-10-CM

## 2016-09-04 DIAGNOSIS — Z3A20 20 weeks gestation of pregnancy: Secondary | ICD-10-CM | POA: Diagnosis not present

## 2016-09-09 ENCOUNTER — Ambulatory Visit (INDEPENDENT_AMBULATORY_CARE_PROVIDER_SITE_OTHER): Payer: Medicaid Other | Admitting: Certified Nurse Midwife

## 2016-09-09 ENCOUNTER — Encounter: Payer: Self-pay | Admitting: Certified Nurse Midwife

## 2016-09-09 VITALS — BP 121/69 | HR 72 | Temp 97.9°F | Wt 164.2 lb

## 2016-09-09 DIAGNOSIS — Z9189 Other specified personal risk factors, not elsewhere classified: Secondary | ICD-10-CM

## 2016-09-09 DIAGNOSIS — B3731 Acute candidiasis of vulva and vagina: Secondary | ICD-10-CM

## 2016-09-09 DIAGNOSIS — Z3480 Encounter for supervision of other normal pregnancy, unspecified trimester: Secondary | ICD-10-CM

## 2016-09-09 DIAGNOSIS — B373 Candidiasis of vulva and vagina: Secondary | ICD-10-CM | POA: Diagnosis not present

## 2016-09-09 DIAGNOSIS — O98812 Other maternal infectious and parasitic diseases complicating pregnancy, second trimester: Secondary | ICD-10-CM | POA: Diagnosis not present

## 2016-09-09 MED ORDER — TERCONAZOLE 0.8 % VA CREA: 1.0000 | TOPICAL_CREAM | Freq: Every day | VAGINAL | 0 refills | Status: DC

## 2016-09-09 MED ORDER — FLUCONAZOLE 150 MG PO TABS: 150.0000 mg | ORAL_TABLET | Freq: Once | ORAL | 0 refills | Status: AC

## 2016-09-09 NOTE — Progress Notes (Signed)
Patient reports good fetal movement, denies pain/pressure. 

## 2016-09-09 NOTE — Progress Notes (Signed)
   PRENATAL VISIT NOTE  Subjective:  Ashliegh Katrinka BlazingSmith is a 26 y.o. [id number] at 751w1d being seen today for ongoing prenatal care.  She is currently monitored for the following issues for this low-risk pregnancy and has Supervision of normal pregnancy, antepartum; Late prenatal care; and History of molar pregnancy, antepartum on her problem list.  Patient reports no complaints.  Contractions: Not present. Vag. Bleeding: None.  Movement: Present. Denies leaking of fluid.   The following portions of the patient's history were reviewed and updated as appropriate: allergies, current medications, past family history, past medical history, past social history, past surgical history and problem list. Problem list updated.  Objective:   Vitals:   09/09/16 1612  BP: 121/69  Pulse: 72  Temp: 97.9 F (36.6 C)  Weight: 164 lb 3.2 oz (74.5 kg)    Fetal Status: Fetal Heart Rate (bpm): 140 Fundal Height: 22 cm Movement: Present     General:  Alert, oriented and cooperative. Patient is in no acute distress.  Skin: Skin is warm and dry. No rash noted.   Cardiovascular: Normal heart rate noted  Respiratory: Normal respiratory effort, no problems with respiration noted  Abdomen: Soft, gravid, appropriate for gestational age. Pain/Pressure: Absent     Pelvic:  Cervical exam deferred        Extremities: Normal range of motion.  Edema: Trace  Mental Status: Normal mood and affect. Normal behavior. Normal judgment and thought content.   Assessment and Plan:  Pregnancy: [id number] at 111w1d  1. Supervision of other normal pregnancy, antepartum     Doing well - AFP, Serum, Open Spina Bifida  2. Yeast vaginitis      - fluconazole (DIFLUCAN) 150 MG tablet; Take 1 tablet (150 mg total) by mouth once.  Dispense: 1 tablet; Refill: 0 - terconazole (TERAZOL 3) 0.8 % vaginal cream; Place 1 applicator vaginally at bedtime.  Dispense: 20 g; Refill: 0  3. At risk for depression      - Ambulatory referral to  Behavioral Health  Preterm labor symptoms and general obstetric precautions including but not limited to vaginal bleeding, contractions, leaking of fluid and fetal movement were reviewed in detail with the patient. Please refer to After Visit Summary for other counseling recommendations.  Return in about 4 weeks (around 10/07/2016) for ROB.   Roe Coombsachelle A Garcia Dalzell, CNM

## 2016-09-11 ENCOUNTER — Other Ambulatory Visit: Payer: Self-pay | Admitting: Certified Nurse Midwife

## 2016-09-11 DIAGNOSIS — Z3480 Encounter for supervision of other normal pregnancy, unspecified trimester: Secondary | ICD-10-CM

## 2016-09-11 LAB — AFP, SERUM, OPEN SPINA BIFIDA
AFP MoM: 1.5
AFP VALUE AFPOSL: 100.9 ng/mL
Gest. Age on Collection Date: 21.1 weeks
Maternal Age At EDD: 25.4 years
OSBR RISK 1 IN: 5468
Test Results:: NEGATIVE
WEIGHT: 164 [lb_av]

## 2016-10-06 ENCOUNTER — Ambulatory Visit (HOSPITAL_COMMUNITY): Payer: Medicaid Other

## 2016-10-07 ENCOUNTER — Telehealth: Payer: Self-pay | Admitting: *Deleted

## 2016-10-07 ENCOUNTER — Encounter: Payer: Medicaid Other | Admitting: Certified Nurse Midwife

## 2016-10-07 ENCOUNTER — Other Ambulatory Visit: Payer: Self-pay | Admitting: Certified Nurse Midwife

## 2016-10-07 DIAGNOSIS — B3731 Acute candidiasis of vulva and vagina: Secondary | ICD-10-CM

## 2016-10-07 DIAGNOSIS — B373 Candidiasis of vulva and vagina: Secondary | ICD-10-CM

## 2016-10-07 DIAGNOSIS — B9689 Other specified bacterial agents as the cause of diseases classified elsewhere: Secondary | ICD-10-CM

## 2016-10-07 DIAGNOSIS — N760 Acute vaginitis: Principal | ICD-10-CM

## 2016-10-07 MED ORDER — METRONIDAZOLE 500 MG PO TABS: 500.0000 mg | ORAL_TABLET | Freq: Two times a day (BID) | ORAL | 0 refills | Status: DC

## 2016-10-07 MED ORDER — FLUCONAZOLE 150 MG PO TABS: 150.0000 mg | ORAL_TABLET | Freq: Once | ORAL | 0 refills | Status: AC

## 2016-10-07 NOTE — Telephone Encounter (Signed)
Patient was treated for yeast at her last visit and now she states she has BV and would like treatment for that.

## 2016-10-07 NOTE — Telephone Encounter (Signed)
Please let her know that flagyl was sent for the BV and 1 dose of diflucan for after the antibiotic as well.  Thank you. R.Kanna Dafoe CNM

## 2016-10-07 NOTE — Telephone Encounter (Signed)
Patient notified

## 2016-10-09 ENCOUNTER — Encounter: Payer: Medicaid Other | Admitting: Certified Nurse Midwife

## 2016-10-16 ENCOUNTER — Ambulatory Visit (HOSPITAL_COMMUNITY)
Admission: RE | Admit: 2016-10-16 | Discharge: 2016-10-16 | Disposition: A | Discharge status: Home / Self Care | Payer: Medicaid Other | Source: Ambulatory Visit | Attending: Certified Nurse Midwife | Admitting: Certified Nurse Midwife

## 2016-10-16 ENCOUNTER — Other Ambulatory Visit: Payer: Self-pay | Admitting: Certified Nurse Midwife

## 2016-10-16 DIAGNOSIS — O0932 Supervision of pregnancy with insufficient antenatal care, second trimester: Secondary | ICD-10-CM | POA: Insufficient documentation

## 2016-10-16 DIAGNOSIS — Z3480 Encounter for supervision of other normal pregnancy, unspecified trimester: Secondary | ICD-10-CM

## 2016-10-16 DIAGNOSIS — Z3A26 26 weeks gestation of pregnancy: Secondary | ICD-10-CM | POA: Insufficient documentation

## 2016-10-16 DIAGNOSIS — Z3482 Encounter for supervision of other normal pregnancy, second trimester: Secondary | ICD-10-CM | POA: Insufficient documentation

## 2016-10-16 DIAGNOSIS — O09892 Supervision of other high risk pregnancies, second trimester: Secondary | ICD-10-CM | POA: Diagnosis not present

## 2016-10-16 DIAGNOSIS — Z362 Encounter for other antenatal screening follow-up: Secondary | ICD-10-CM | POA: Diagnosis not present

## 2016-10-17 ENCOUNTER — Other Ambulatory Visit: Payer: Self-pay | Admitting: Certified Nurse Midwife

## 2016-10-17 DIAGNOSIS — O09A0 Supervision of pregnancy with history of molar pregnancy, unspecified trimester: Secondary | ICD-10-CM

## 2016-10-17 DIAGNOSIS — Z3480 Encounter for supervision of other normal pregnancy, unspecified trimester: Secondary | ICD-10-CM

## 2016-10-21 ENCOUNTER — Other Ambulatory Visit (HOSPITAL_COMMUNITY)
Admission: RE | Admit: 2016-10-21 | Discharge: 2016-10-21 | Disposition: A | Discharge status: Home / Self Care | Payer: Medicaid Other | Source: Ambulatory Visit | Attending: Certified Nurse Midwife | Admitting: Certified Nurse Midwife

## 2016-10-21 ENCOUNTER — Ambulatory Visit (INDEPENDENT_AMBULATORY_CARE_PROVIDER_SITE_OTHER): Payer: Medicaid Other | Admitting: Certified Nurse Midwife

## 2016-10-21 VITALS — BP 118/64 | HR 111 | Wt 167.2 lb

## 2016-10-21 DIAGNOSIS — O09A2 Supervision of pregnancy with history of molar pregnancy, second trimester: Secondary | ICD-10-CM

## 2016-10-21 DIAGNOSIS — O09A0 Supervision of pregnancy with history of molar pregnancy, unspecified trimester: Secondary | ICD-10-CM

## 2016-10-21 DIAGNOSIS — Z3482 Encounter for supervision of other normal pregnancy, second trimester: Secondary | ICD-10-CM | POA: Insufficient documentation

## 2016-10-21 DIAGNOSIS — O0932 Supervision of pregnancy with insufficient antenatal care, second trimester: Secondary | ICD-10-CM | POA: Diagnosis not present

## 2016-10-21 DIAGNOSIS — Z3480 Encounter for supervision of other normal pregnancy, unspecified trimester: Secondary | ICD-10-CM

## 2016-10-21 DIAGNOSIS — B3731 Acute candidiasis of vulva and vagina: Secondary | ICD-10-CM

## 2016-10-21 DIAGNOSIS — O23592 Infection of other part of genital tract in pregnancy, second trimester: Secondary | ICD-10-CM | POA: Insufficient documentation

## 2016-10-21 DIAGNOSIS — B373 Candidiasis of vulva and vagina: Secondary | ICD-10-CM | POA: Insufficient documentation

## 2016-10-21 DIAGNOSIS — O0930 Supervision of pregnancy with insufficient antenatal care, unspecified trimester: Secondary | ICD-10-CM

## 2016-10-21 DIAGNOSIS — Z3A27 27 weeks gestation of pregnancy: Secondary | ICD-10-CM | POA: Insufficient documentation

## 2016-10-21 MED ORDER — FLUCONAZOLE 150 MG PO TABS: 150.0000 mg | ORAL_TABLET | Freq: Once | ORAL | 0 refills | Status: AC

## 2016-10-21 MED ORDER — TERCONAZOLE 0.8 % VA CREA: 1.0000 | TOPICAL_CREAM | Freq: Every day | VAGINAL | 0 refills | Status: DC

## 2016-10-21 NOTE — Progress Notes (Signed)
   PRENATAL VISIT NOTE  Subjective:  Jetty Katrinka BlazingSmith is a 26 y.o. [id number] at 4633w1d being seen today for ongoing prenatal care.  She is currently monitored for the following issues for this low-risk pregnancy and has Supervision of normal pregnancy, antepartum; Late prenatal care; and History of molar pregnancy, antepartum on her problem list.  Patient reports vaginal irritation and clear foul odor discharge.  Contractions: Not present. Vag. Bleeding: None.  Movement: Present. Denies leaking of fluid.   The following portions of the patient's history were reviewed and updated as appropriate: allergies, current medications, past family history, past medical history, past social history, past surgical history and problem list. Problem list updated.  Objective:   Vitals:   10/21/16 1527  BP: 118/64  Pulse: (!) 111  Weight: 167 lb 3.2 oz (75.8 kg)    Fetal Status: Fetal Heart Rate (bpm): 142 Fundal Height: 27 cm Movement: Present     General:  Alert, oriented and cooperative. Patient is in no acute distress.  Skin: Skin is warm and dry. No rash noted.   Cardiovascular: Normal heart rate noted  Respiratory: Normal respiratory effort, no problems with respiration noted  Abdomen: Soft, gravid, appropriate for gestational age. Pain/Pressure: Present     Pelvic:  Cervical exam performed      C  Extremities: Normal range of motion.  Edema: None  Mental Status: Normal mood and affect. Normal behavior. Normal judgment and thought content.   Assessment and Plan:  Pregnancy: [id number] at 2733w1d  1. Supervision of other normal pregnancy, antepartum Doing well,2ogtt this week.  2. Late prenatal care  Instructed patient on scheduled visits.  2. Yeast vaginitis      - fluconazole (DIFLUCAN) 150 MG tablet; Take 1 tablet (150 mg total) by mouth once.  Dispense: 1 tablet; Refill: 0 - terconazole (TERAZOL 3) 0.8 % vaginal cream; Place 1 applicator vaginally at bedtime.  Dispense: 20 g; Refill:  0   Preterm labor symptoms and general obstetric precautions including but not limited to vaginal bleeding, contractions, leaking of fluid and fetal movement were reviewed in detail with the patient. Please refer to After Visit Summary for other counseling recommendations.  Return in about 2 weeks (around 11/04/2016) for ROB, 2 hr OGTT next week.   Roe Coombsenney, Rachelle A, CNM

## 2016-10-21 NOTE — Progress Notes (Signed)
Patient did not take her flagyl. She reports she fell yesterday on her side. She has been feeling movement and she reports no pain related to the fall. She does have a lot of pressure down low.

## 2016-10-21 NOTE — Addendum Note (Signed)
Addended by: Elby BeckPAUL, JANE F on: 10/21/2016 04:15 PM   Modules accepted: Orders

## 2016-10-22 LAB — CERVICOVAGINAL ANCILLARY ONLY
BACTERIAL VAGINITIS: NEGATIVE
CHLAMYDIA, DNA PROBE: NEGATIVE
Candida vaginitis: POSITIVE — AB
NEISSERIA GONORRHEA: NEGATIVE
Trichomonas: NEGATIVE

## 2016-10-26 ENCOUNTER — Other Ambulatory Visit: Payer: Self-pay | Admitting: Certified Nurse Midwife

## 2016-10-31 ENCOUNTER — Other Ambulatory Visit: Payer: Medicaid Other

## 2016-11-04 ENCOUNTER — Encounter: Payer: Medicaid Other | Admitting: Certified Nurse Midwife

## 2016-11-05 ENCOUNTER — Encounter: Payer: Medicaid Other | Admitting: Certified Nurse Midwife

## 2016-11-13 ENCOUNTER — Telehealth: Payer: Self-pay | Admitting: *Deleted

## 2016-11-13 NOTE — Telephone Encounter (Signed)
Called patient to try and reschedule her missed prenatal appointments and 2hr GTT.  [place] actually is in [place] and delivered yesterday at 30.[redacted]wks gestation.  Baby is currently in the NICU.  [facility]

## 2016-11-18 ENCOUNTER — Ambulatory Visit (HOSPITAL_COMMUNITY): Payer: Medicaid Other

## 2017-04-20 ENCOUNTER — Encounter (HOSPITAL_COMMUNITY): Payer: Self-pay

## 2017-09-21 ENCOUNTER — Encounter: Payer: Self-pay | Admitting: *Deleted

## 2018-02-19 IMAGING — US US OB LIMITED
1 series · 14 of 24 positions shown · non-contrast
Comparison: none

CLINICAL DATA: Vaginal bleeding 2 hours.

EXAM:
LIMITED OBSTETRIC ULTRASOUND

[Series 1: us ob limited · 0.26mm/px · 24 acquisitions, 14 frames shown]
[im 1/24]
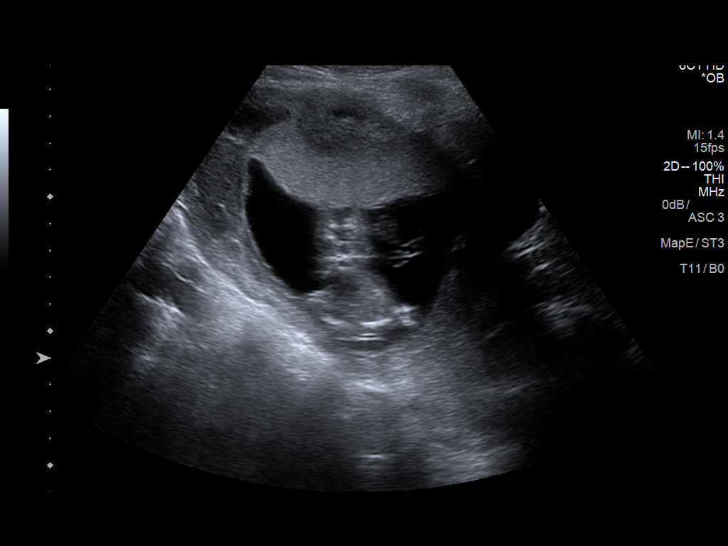
[im 3/24]
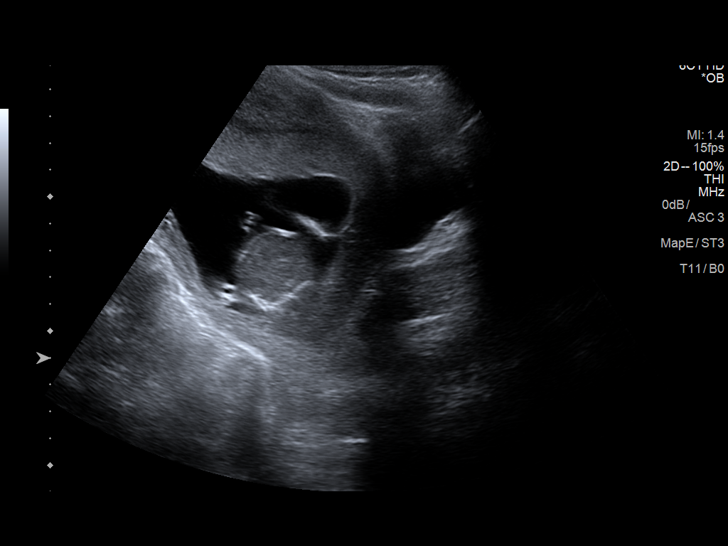
[im 5/24]
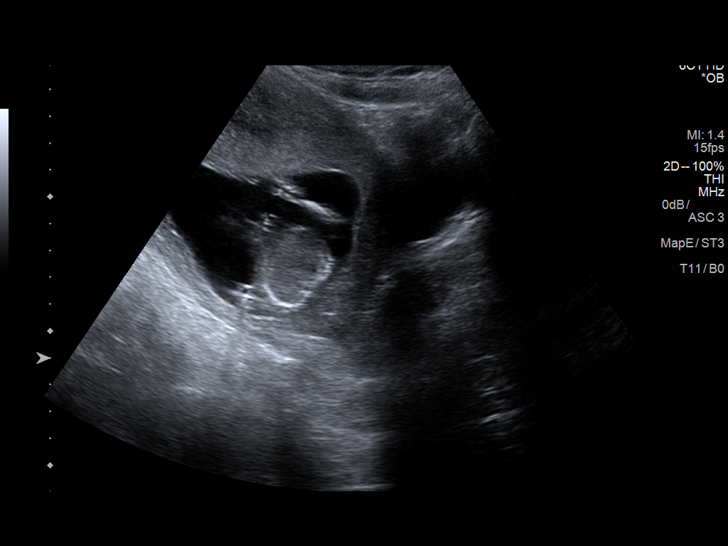
[im 7/24]
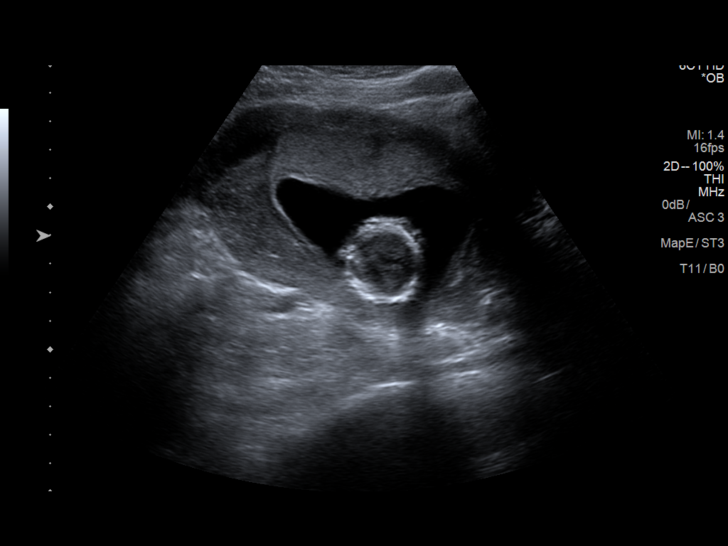
[im 8/24]
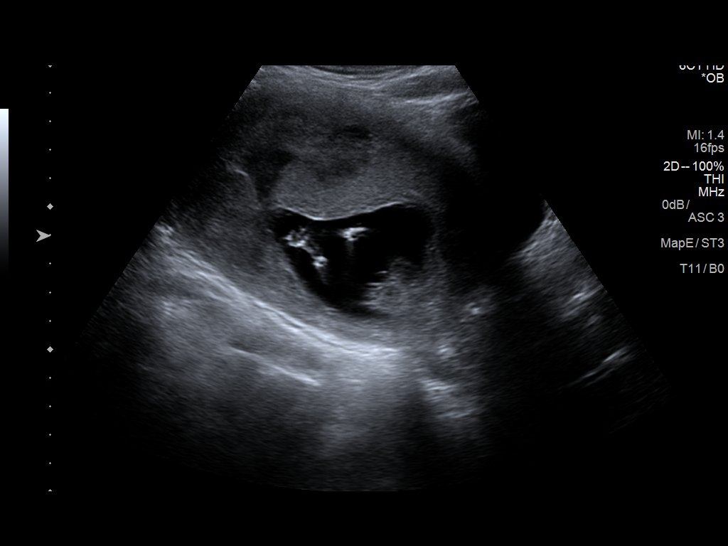
[im 10/24]
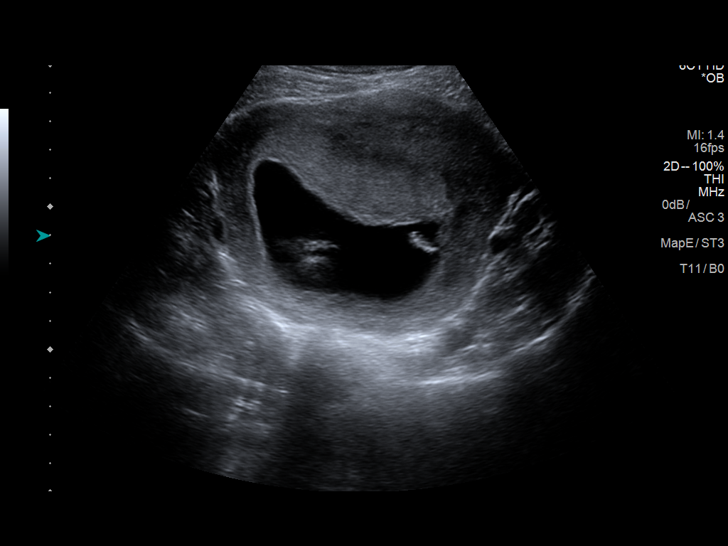
[im 12/24]
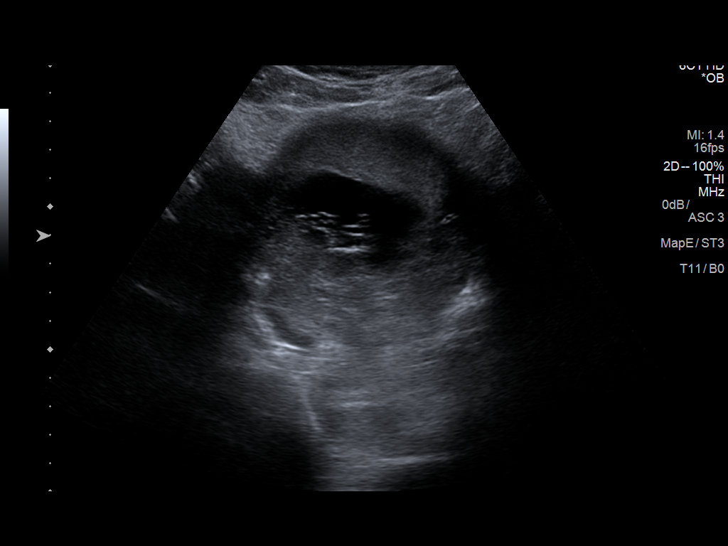
[im 13/24]
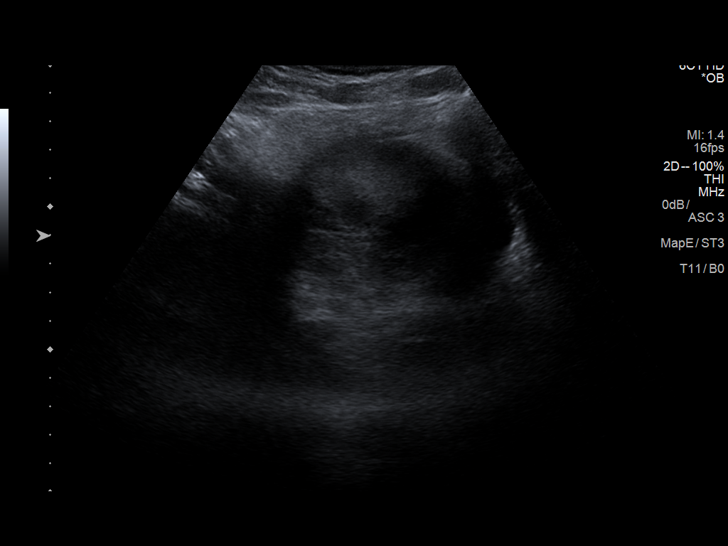
[im 15/24]
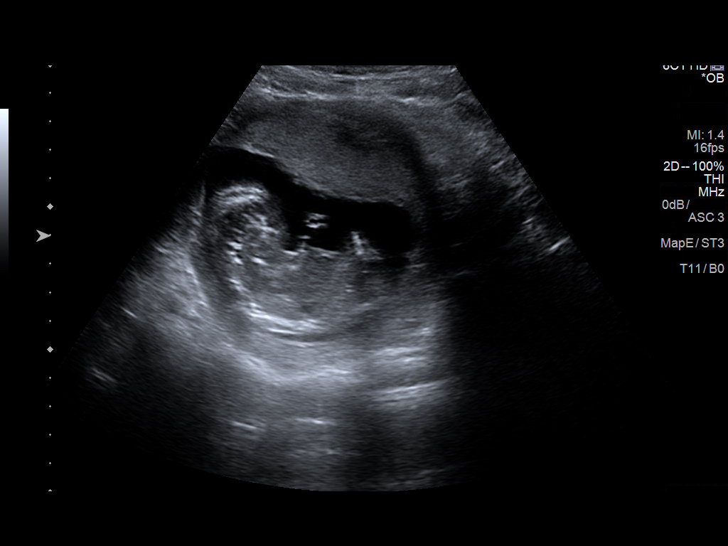
[im 17/24]
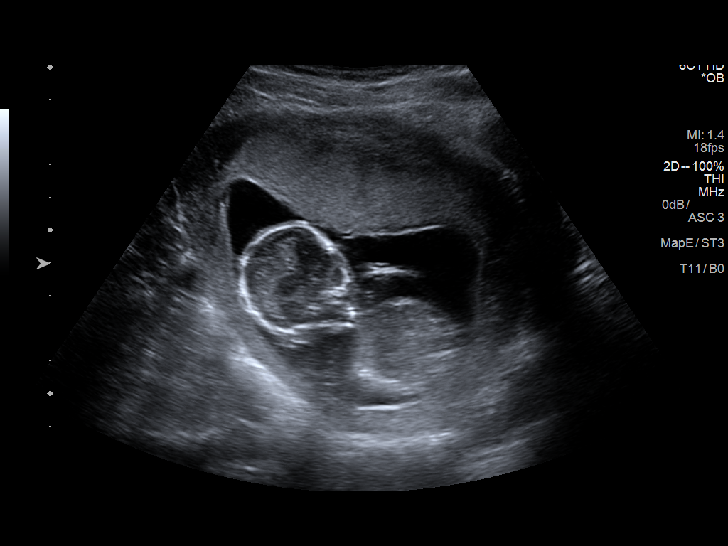
[im 19/24]
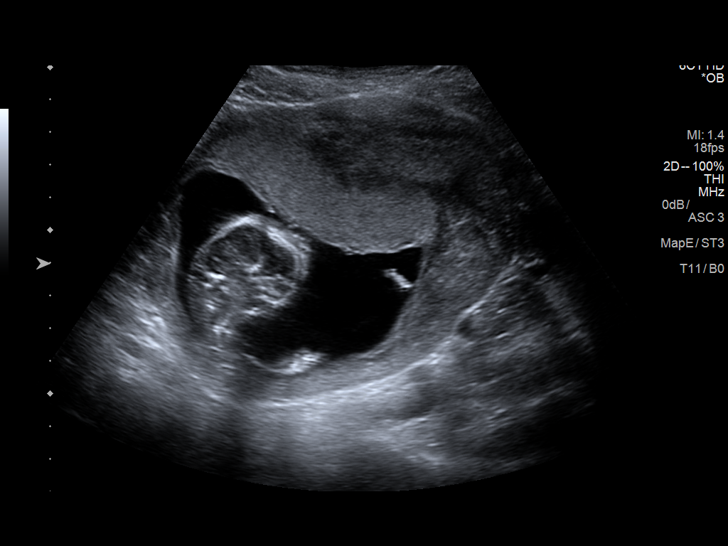
[im 20/24]
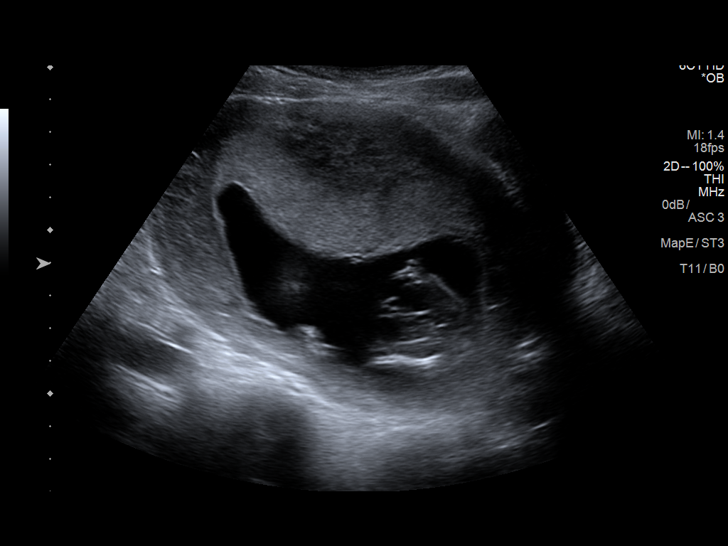
[im 22/24]
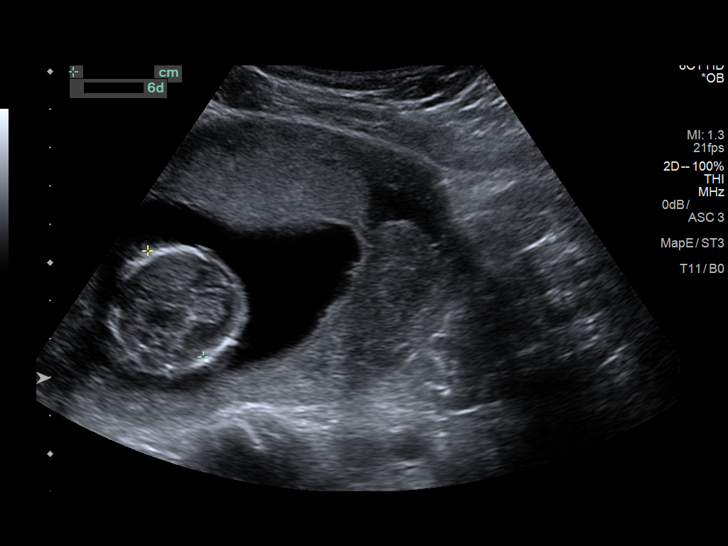
[im 24/24]
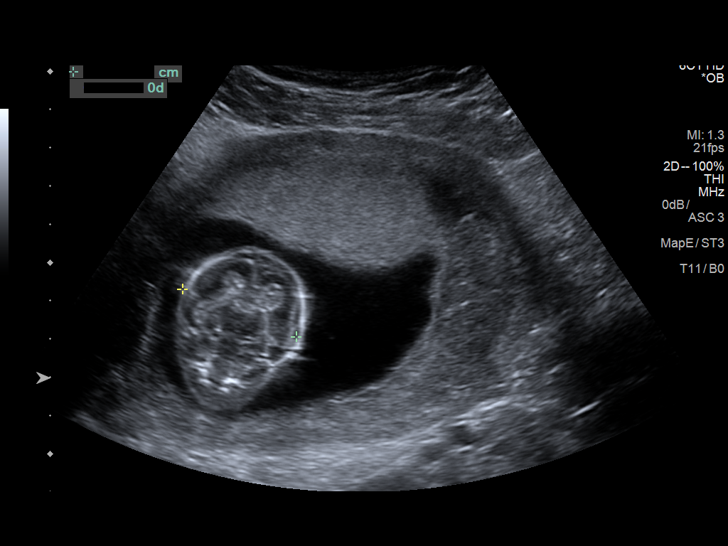

[14 of 24 positions shown; findings below may reference images not displayed]

FINDINGS: Number of Fetuses:  Single

Heart Rate:  155 bpm

Movement: Yes

Presentation: Transverse with head to maternal right side.

Placental Location: Anterior

Previa: No

Amniotic Fluid (Subjective):  Within normal limits.

BPD:  3.2cm 16w  0d

MATERNAL FINDINGS:

Cervix:  Appears closed.

Uterus/Adnexae:  No abnormality visualized.
IMPRESSION: Single live IUP with estimated gestational age 16 weeks 0 days.

This exam is performed on an emergent basis and does not
comprehensively evaluate fetal size, dating, or anatomy; follow-up
complete OB US should be considered if further fetal assessment is
warranted.
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
## 2018-07-09 IMAGING — US US MFM OB LIMITED
1 series · 15 of 28 positions shown · non-contrast
Comparison: none

[Series 1: us mfm ob limited · 15 of 34 slices shown]
[im 1/34]
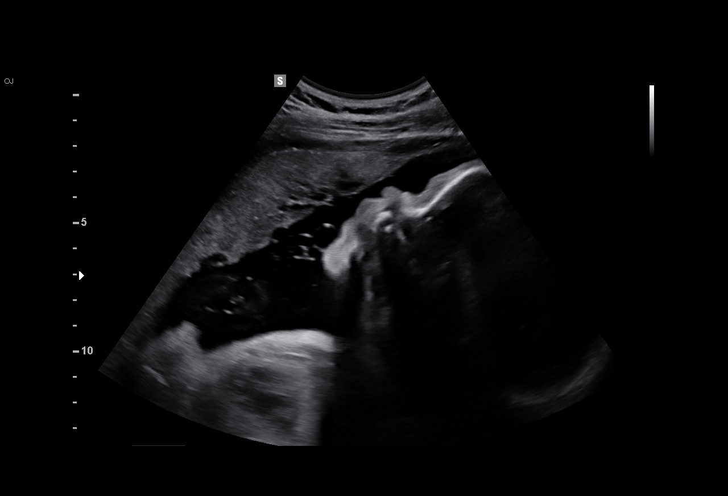
[im 3/34]
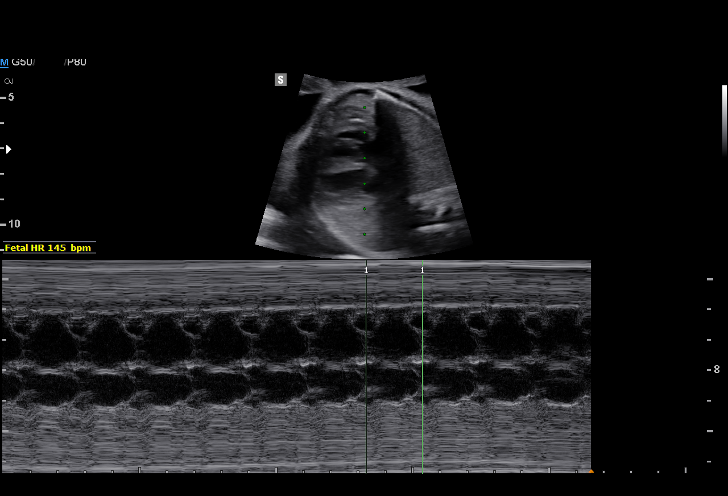
[im 5/34]
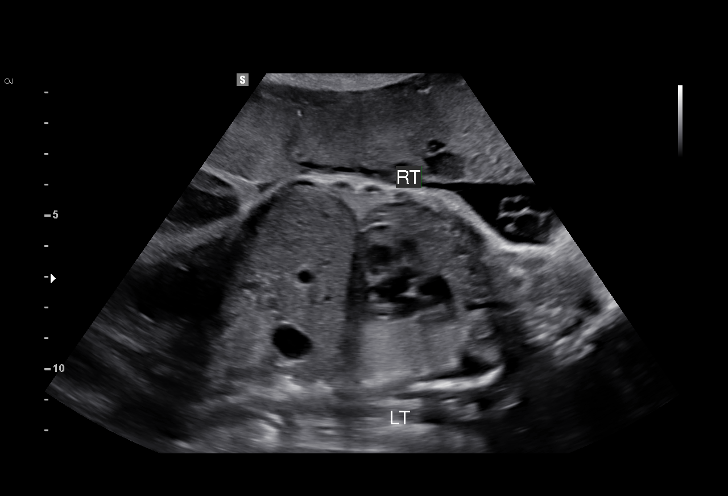
[im 8/34]
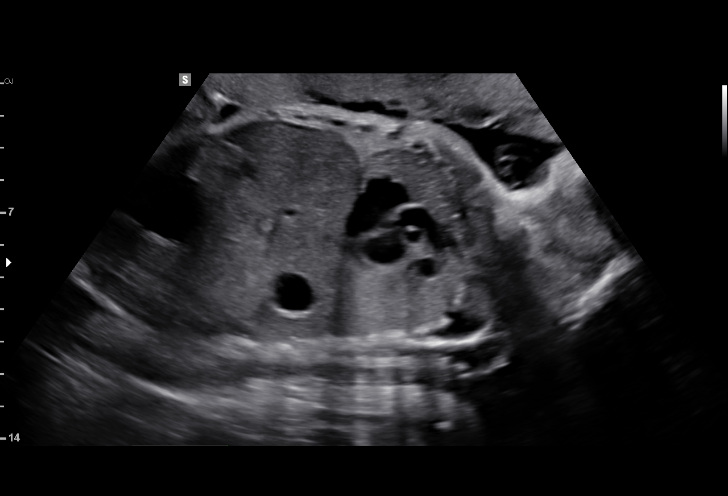
[im 10/34]
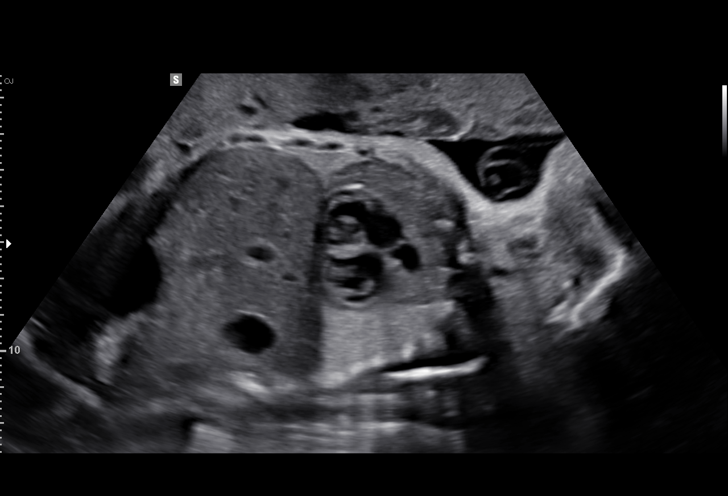
[im 13/34]
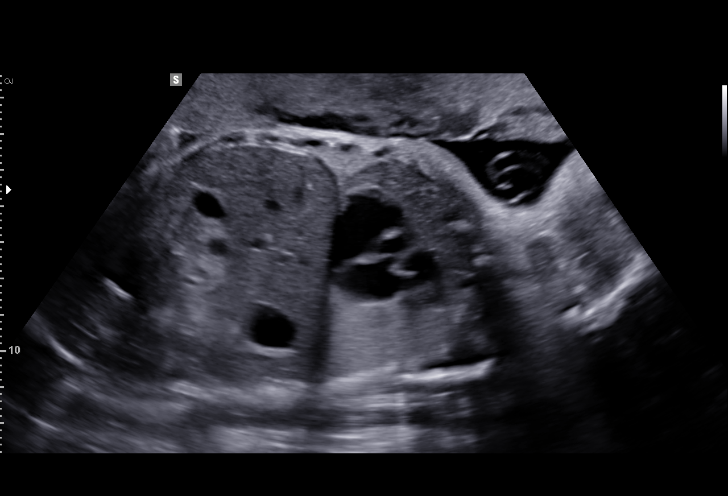
[im 15/34]
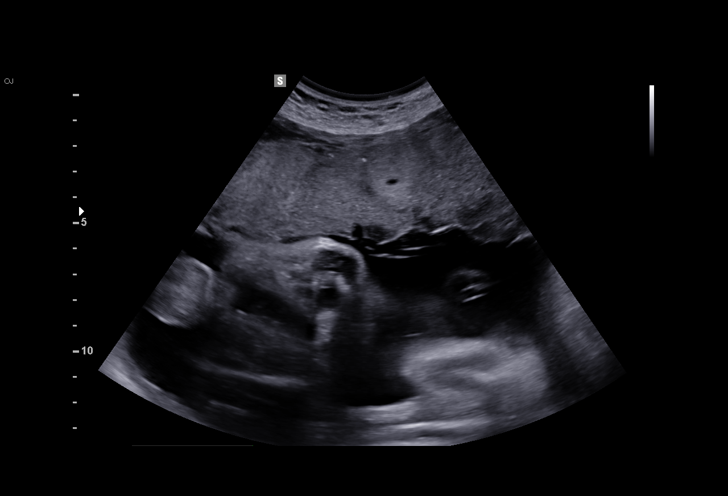
[im 18/34]
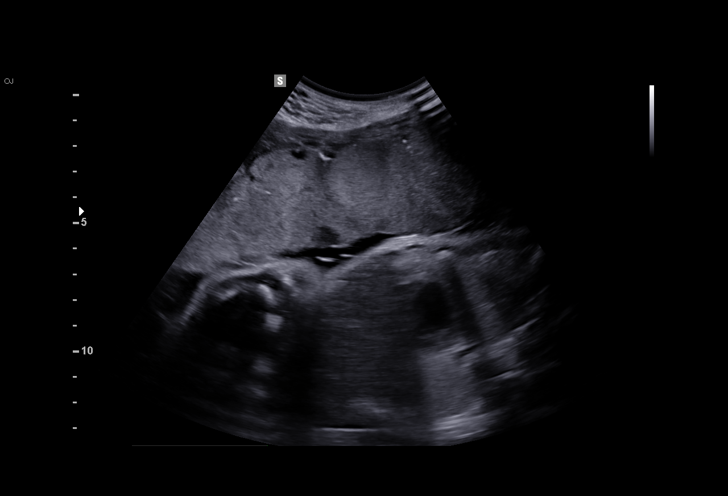
[im 19/34]
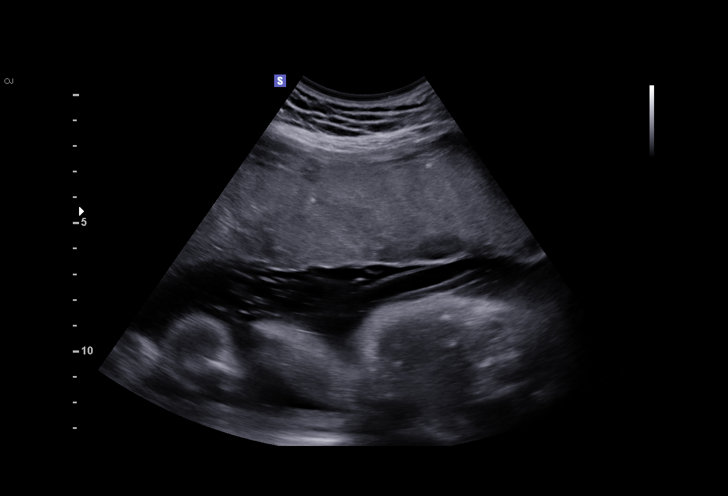
[im 21/34]
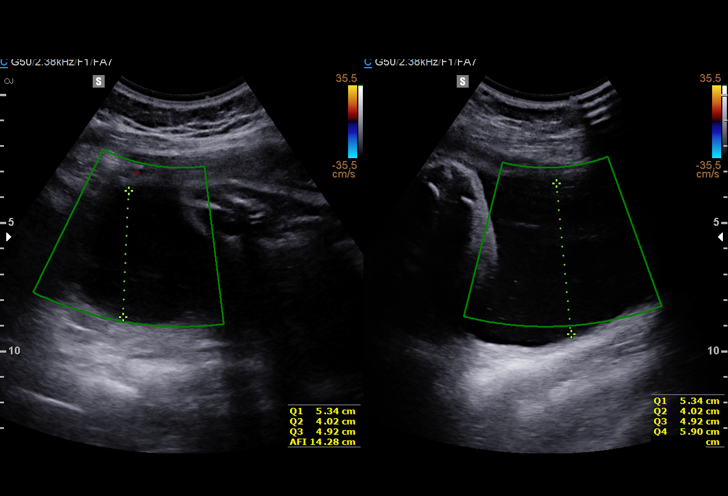
[im 24/34]
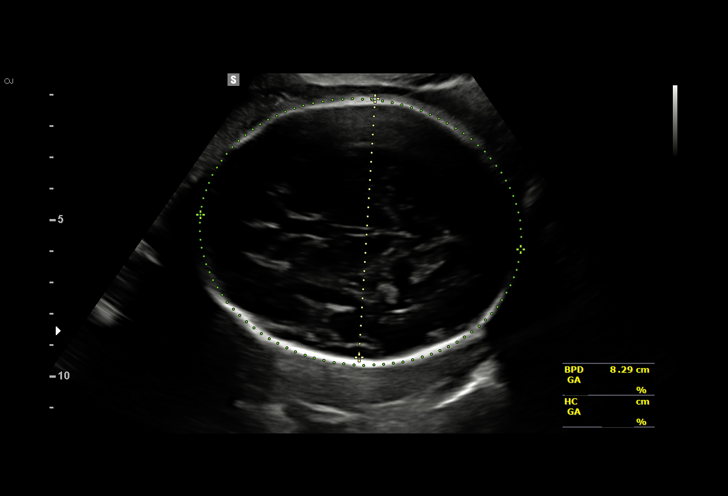
[im 26/34]
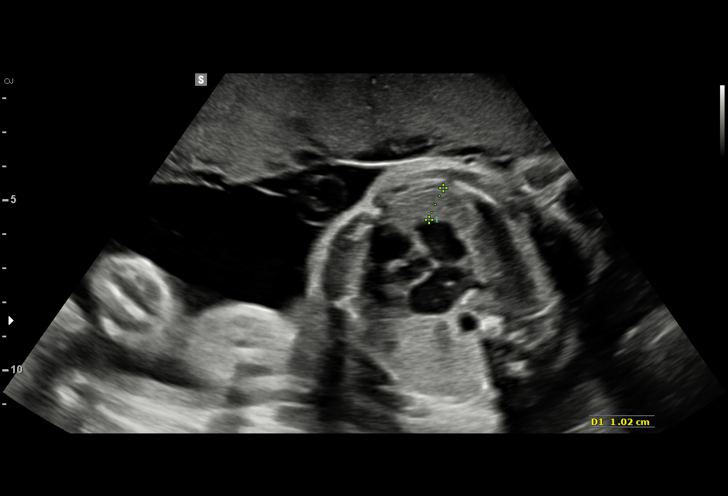
[im 29/34]
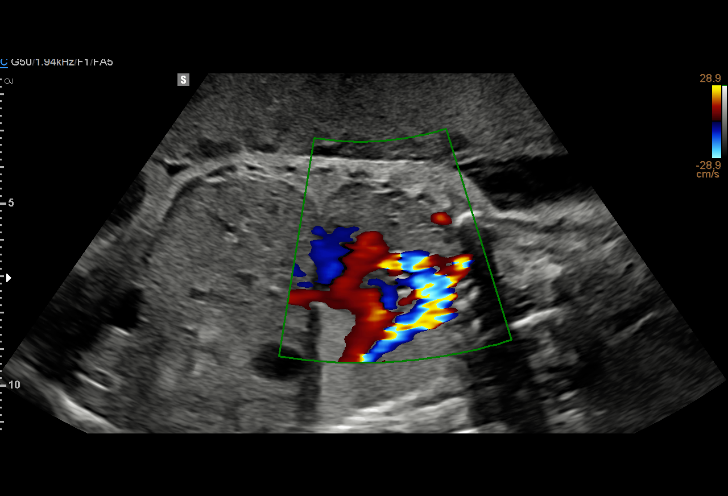
[im 31/34]
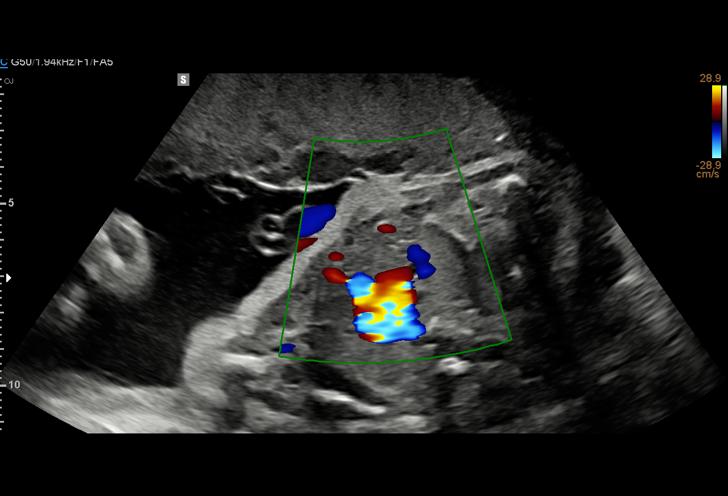
[im 34/34]
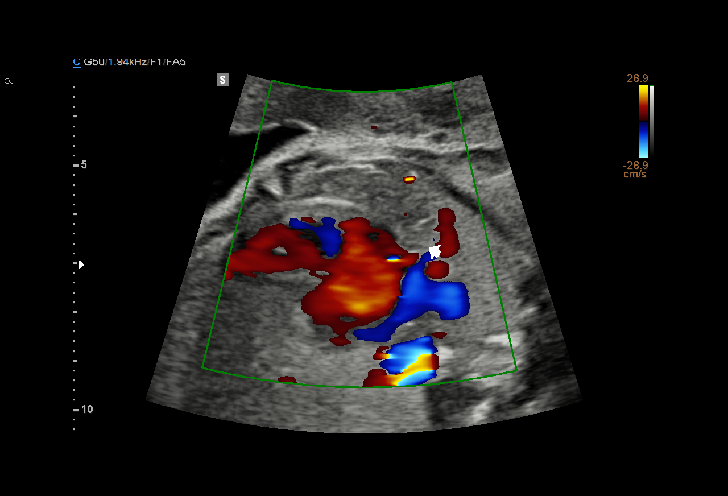

[15 of 28 positions shown; findings below may reference images not displayed]

[facility]
[REDACTED]-
Faculty Physician

1  ANDRIY RENDER            [id number]      [id number]     [id number]
Indications

31 weeks gestation of pregnancy
Subchorionic hemorrhage, antepartum
Fetal abnormality - other known or
suspected: type 3 CPAM
OB History

Gravidity:    5         Term:   1        Prem:   0        SAB:   2
TOP:          1       Ectopic:  0        Living: 1
Fetal Evaluation

Num Of Fetuses:     1
Fetal Heart         145
Rate(bpm):
Cardiac Activity:   Observed
Presentation:       Cephalic
Placenta:           Anterior, above cervical os
P. Cord Insertion:  Previously Visualized

Amniotic Fluid
AFI FV:      Subjectively within normal limits

AFI Sum(cm)     %Tile       Largest Pocket(cm)
20.18           77

RUQ(cm)       RLQ(cm)       LUQ(cm)        LLQ(cm)
5.34
Gestational Age

LMP:           38w 5d       Date:   04/13/15                 EDD:   01/18/16
Best:          31w 6d    Det. By:   U/S  (10/08/15)          EDD:   03/06/16
Impression

Single IUP at 31w 6d
Follow up due to suspected type 3 CPAM
No mediastinal shift is appreciated; no evidence of fetal
hydrops
Possible mass in right hemithorax (difficult to identify the
borders) measureing 3.6 x 1.4 x 1 cm.
CVR =
Normal amniotic fluid volume

Recommendations

Peds surgery consultation scheduled on [DATE].
Follow up ultrasound for growth / reevaluation on [DATE]

## 2019-07-01 ENCOUNTER — Emergency Department (HOSPITAL_COMMUNITY): Payer: Medicaid Other

## 2019-07-01 ENCOUNTER — Emergency Department (HOSPITAL_COMMUNITY)
Admission: EM | Admit: 2019-07-01 | Discharge: 2019-07-01 | Disposition: A | Discharge status: Home / Self Care | Payer: Medicaid Other | Attending: Emergency Medicine | Admitting: Emergency Medicine

## 2019-07-01 ENCOUNTER — Other Ambulatory Visit: Payer: Self-pay

## 2019-07-01 DIAGNOSIS — R519 Headache, unspecified: Secondary | ICD-10-CM | POA: Insufficient documentation

## 2019-07-01 DIAGNOSIS — M25562 Pain in left knee: Secondary | ICD-10-CM | POA: Insufficient documentation

## 2019-07-01 DIAGNOSIS — R0781 Pleurodynia: Secondary | ICD-10-CM | POA: Diagnosis not present

## 2019-07-01 DIAGNOSIS — J3489 Other specified disorders of nose and nasal sinuses: Secondary | ICD-10-CM | POA: Diagnosis not present

## 2019-07-01 LAB — POC URINE PREG, ED: Preg Test, Ur: NEGATIVE

## 2019-07-01 MED ORDER — HYDROCODONE-ACETAMINOPHEN 5-325 MG PO TABS
1.0000 | ORAL_TABLET | Freq: Once | ORAL | Status: AC
  Administered 2019-07-01: 1 via ORAL
  Filled 2019-07-01: qty 1

## 2019-07-01 NOTE — Progress Notes (Signed)
Orthopedic Tech Progress Note Patient Details:  Leah Morris 08-Nov-1990 [id number]  Ortho Devices Type of Ortho Device: Knee Sleeve Ortho Device/Splint Location: left Ortho Device/Splint Interventions: Application   Post Interventions Patient Tolerated: Well Instructions Provided: Care of device   Saul Fordyce 07/01/2019, 11:35 AM

## 2019-07-01 NOTE — Discharge Instructions (Signed)
You may take Tylenol and ibuprofen as needed for pain.  Do not take more than 4000 mg Tylenol or more than 2400 mg ibuprofen.  Make sure to ice and elevate your knee.  I have given you a splint.  If you continue to have pain please follow-up with orthopedics.
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
## 2019-08-16 ENCOUNTER — Ambulatory Visit: Payer: Medicaid Other | Admitting: *Deleted

## 2019-08-16 DIAGNOSIS — Z3481 Encounter for supervision of other normal pregnancy, first trimester: Secondary | ICD-10-CM

## 2019-08-16 DIAGNOSIS — Z3480 Encounter for supervision of other normal pregnancy, unspecified trimester: Secondary | ICD-10-CM

## 2019-08-16 MED ORDER — BLOOD PRESSURE MONITOR KIT: 1.0000 | PACK | Freq: Once | 0 refills | Status: AC

## 2019-08-16 MED ORDER — VITAFOL GUMMIES 3.33-0.333-34.8 MG PO CHEW: 3.0000 | CHEWABLE_TABLET | Freq: Every day | ORAL | 12 refills | Status: AC

## 2019-08-16 NOTE — Progress Notes (Signed)
Patient seen and assessed by nursing staff during this encounter. I have reviewed the chart and agree with the documentation and plan.  Tycho Cheramie, MD 08/16/2019 3:54 PM    

## 2019-08-16 NOTE — Progress Notes (Signed)
I connected with  Leah Morris on 08/16/19 by a video enabled telemedicine application and verified that I am speaking with the correct person using two identifiers.   I discussed the limitations of evaluation and management by telemedicine. The patient expressed understanding and agreed to proceed.   PRENATAL INTAKE SUMMARY  Leah Morris presents today New OB Nurse Interview.  LMP 06/09/2019 OB History    Gravida  7   Para  3   Term  2   Preterm  1   AB  3   Living  3     SAB  1   TAB  1   Ectopic  0   Multiple  0   Live Births  3          I have reviewed the patient's medical, obstetrical, social, and family histories, medications, and available lab results.  SUBJECTIVE She complains of nausea with some vomiting   OBJECTIVE Initial Physical Exam (New OB)  GENERAL APPEARANCE: sounds appropriate over televisit   ASSESSMENT Normal pregnancy  PLAN Prenatal care at [facility] Recommend Unisom and Vit B6 for nausea Babyscripts entered today Blood Pressure cuff ordered PNV to pharmacy

## 2019-08-23 ENCOUNTER — Other Ambulatory Visit: Payer: Self-pay

## 2019-08-23 ENCOUNTER — Encounter: Payer: Self-pay | Admitting: Family Medicine

## 2019-08-23 ENCOUNTER — Other Ambulatory Visit (HOSPITAL_COMMUNITY)
Admission: RE | Admit: 2019-08-23 | Discharge: 2019-08-23 | Disposition: A | Discharge status: Home / Self Care | Payer: Medicaid Other | Source: Ambulatory Visit | Attending: Family Medicine | Admitting: Family Medicine

## 2019-08-23 ENCOUNTER — Ambulatory Visit (INDEPENDENT_AMBULATORY_CARE_PROVIDER_SITE_OTHER): Payer: Medicaid Other | Admitting: Family Medicine

## 2019-08-23 VITALS — BP 112/66 | HR 73 | Temp 98.6°F | Wt 186.2 lb

## 2019-08-23 DIAGNOSIS — Z3A10 10 weeks gestation of pregnancy: Secondary | ICD-10-CM | POA: Diagnosis not present

## 2019-08-23 DIAGNOSIS — Z3481 Encounter for supervision of other normal pregnancy, first trimester: Secondary | ICD-10-CM

## 2019-08-23 DIAGNOSIS — O34219 Maternal care for unspecified type scar from previous cesarean delivery: Secondary | ICD-10-CM

## 2019-08-23 DIAGNOSIS — Z3480 Encounter for supervision of other normal pregnancy, unspecified trimester: Secondary | ICD-10-CM | POA: Diagnosis present

## 2019-08-23 DIAGNOSIS — O09A1 Supervision of pregnancy with history of molar pregnancy, first trimester: Secondary | ICD-10-CM | POA: Diagnosis not present

## 2019-08-23 DIAGNOSIS — R222 Localized swelling, mass and lump, trunk: Secondary | ICD-10-CM

## 2019-08-23 DIAGNOSIS — O09A0 Supervision of pregnancy with history of molar pregnancy, unspecified trimester: Secondary | ICD-10-CM

## 2019-08-23 DIAGNOSIS — O34211 Maternal care for low transverse scar from previous cesarean delivery: Secondary | ICD-10-CM

## 2019-08-23 NOTE — Progress Notes (Signed)
Pt presents for NOB c/o nausea and fatigue.  Pt c/o painful marble size nodule RLQ

## 2019-08-23 NOTE — Progress Notes (Signed)
Subjective:  Leah Morris is a 29 y.o. [phone] at [redacted]w[redacted]d being seen today for ongoing prenatal care.  She is currently monitored for the following issues for this low-risk pregnancy and has History of molar pregnancy, antepartum; Supervision of other normal pregnancy, antepartum; and History of cesarean delivery, currently pregnant on their problem list.  Patient reports right lower abdominal nodule.  Contractions: Not present. Vag. Bleeding: None.  Movement: Absent. Denies leaking of fluid.   The following portions of the patient's history were reviewed and updated as appropriate: allergies, current medications, past family history, past medical history, past social history, past surgical history and problem list. Problem list updated.  Objective:   Vitals:   08/23/19 1055  BP: 112/66  Pulse: 73  Temp: 98.6 F (37 C)  Weight: 186 lb 3.2 oz (84.5 kg)    Fetal Status: Fetal Heart Rate (bpm): 160   Movement: Absent     General:  Alert, oriented and cooperative. Patient is in no acute distress.  Skin: Skin is warm and dry. No rash noted.   Cardiovascular: Normal heart rate noted  Respiratory: Normal respiratory effort, no problems with respiration noted  Abdomen: Soft, gravid, appropriate for gestational age. Pain/Pressure: Absent   Firm 1.5-2 cm nodule RLQ/Right inguinal area, non-reducible  Pelvic: Vag. Bleeding: None     Cervical exam performed      Pap smear obtained  Extremities: Normal range of motion.  Edema: None  Mental Status: Normal mood and affect. Normal behavior. Normal judgment and thought content.    Assessment and Plan:  Pregnancy: [id number] at [redacted]w[redacted]d  1. Supervision of other normal pregnancy, antepartum - Oriented to practice - Continue routine prenatal care - Cytology - PAP( [place]) - Cervicovaginal ancillary only( [place]) - Enroll Patient in Babyscripts - Babyscripts Schedule Optimization - Genetic Screening - Culture, OB Urine - Obstetric Panel,  Including HIV - [place] MFM OB COMP + 14 WK; Future  2. History of cesarean delivery, currently pregnant - Delivered at [facility] in [place] via STAT CS - Records request- need Op Note - May desire repeat C-Section and BTL  3. History of molar pregnancy, antepartum  4. Subcutaneous nodule of abdominal wall - DDx: lymph node, scarring, less likely lipoma - US Abdomen Limited; Future  Preterm labor symptoms and general obstetric precautions including but not limited to vaginal bleeding, contractions, leaking of fluid and fetal movement were reviewed in detail with the patient. Please refer to After Visit Summary for other counseling recommendations.  Return in about 4 weeks (around 09/20/2019) for ROB.   Josph Norfleet L, DO

## 2019-08-23 NOTE — Patient Instructions (Signed)
First Trimester of Pregnancy  The first trimester of pregnancy is from week 1 until the end of week 13 (months 1 through 3). During this time, your baby will begin to develop inside you. At 6-8 weeks, the eyes and face are formed, and the heartbeat can be seen on ultrasound. At the end of 12 weeks, all the baby's organs are formed. Prenatal care is all the medical care you receive before the birth of your baby. Make sure you get good prenatal care and follow all of your doctor's instructions. Follow these instructions at home: Medicines  Take over-the-counter and prescription medicines only as told by your doctor. Some medicines are safe and some medicines are not safe during pregnancy.  Take a prenatal vitamin that contains at least 600 micrograms (mcg) of folic acid.  If you have trouble pooping (constipation), take medicine that will make your stool soft (stool softener) if your doctor approves. Eating and drinking   Eat regular, healthy meals.  Your doctor will tell you the amount of weight gain that is right for you.  Avoid raw meat and uncooked cheese.  If you feel sick to your stomach (nauseous) or throw up (vomit): ? Eat 4 or 5 small meals a day instead of 3 large meals. ? Try eating a few soda crackers. ? Drink liquids between meals instead of during meals.  To prevent constipation: ? Eat foods that are high in fiber, like fresh fruits and vegetables, whole grains, and beans. ? Drink enough fluids to keep your pee (urine) clear or pale yellow. Activity  Exercise only as told by your doctor. Stop exercising if you have cramps or pain in your lower belly (abdomen) or low back.  Do not exercise if it is too hot, too humid, or if you are in a place of great height (high altitude).  Try to avoid standing for long periods of time. Move your legs often if you must stand in one place for a long time.  Avoid heavy lifting.  Wear low-heeled shoes. Sit and stand up  straight.  You can have sex unless your doctor tells you not to. Relieving pain and discomfort  Wear a good support bra if your breasts are sore.  Take warm water baths (sitz baths) to soothe pain or discomfort caused by hemorrhoids. Use hemorrhoid cream if your doctor says it is okay.  Rest with your legs raised if you have leg cramps or low back pain.  If you have puffy, bulging veins (varicose veins) in your legs: ? Wear support hose or compression stockings as told by your doctor. ? Raise (elevate) your feet for 15 minutes, 3-4 times a day. ? Limit salt in your food. Prenatal care  Schedule your prenatal visits by the twelfth week of pregnancy.  Write down your questions. Take them to your prenatal visits.  Keep all your prenatal visits as told by your doctor. This is important. Safety  Wear your seat belt at all times when driving.  Make a list of emergency phone numbers. The list should include numbers for family, friends, the hospital, and police and fire departments. General instructions  Ask your doctor for a referral to a local prenatal class. Begin classes no later than at the start of month 6 of your pregnancy.  Ask for help if you need counseling or if you need help with nutrition. Your doctor can give you advice or tell you where to go for help.  Do not use hot tubs, steam   rooms, or saunas.  Do not douche or use tampons or scented sanitary pads.  Do not cross your legs for long periods of time.  Avoid all herbs and alcohol. Avoid drugs that are not approved by your doctor.  Do not use any tobacco products, including cigarettes, chewing tobacco, and electronic cigarettes. If you need help quitting, ask your doctor. You may get counseling or other support to help you quit.  Avoid cat litter boxes and soil used by cats. These carry germs that can cause birth defects in the baby and can cause a loss of your baby (miscarriage) or stillbirth.  Visit your dentist.  At home, brush your teeth with a soft toothbrush. Be gentle when you floss. Contact a doctor if:  You are dizzy.  You have mild cramps or pressure in your lower belly.  You have a nagging pain in your belly area.  You continue to feel sick to your stomach, you throw up, or you have watery poop (diarrhea).  You have a bad smelling fluid coming from your vagina.  You have pain when you pee (urinate).  You have increased puffiness (swelling) in your face, hands, legs, or ankles. Get help right away if:  You have a fever.  You are leaking fluid from your vagina.  You have spotting or bleeding from your vagina.  You have very bad belly cramping or pain.  You gain or lose weight rapidly.  You throw up blood. It may look like coffee grounds.  You are around people who have German measles, fifth disease, or chickenpox.  You have a very bad headache.  You have shortness of breath.  You have any kind of trauma, such as from a fall or a car accident. Summary  The first trimester of pregnancy is from week 1 until the end of week 13 (months 1 through 3).  To take care of yourself and your unborn baby, you will need to eat healthy meals, take medicines only if your doctor tells you to do so, and do activities that are safe for you and your baby.  Keep all follow-up visits as told by your doctor. This is important as your doctor will have to ensure that your baby is healthy and growing well. This information is not intended to replace advice given to you by your health care provider. Make sure you discuss any questions you have with your health care provider. Document Revised: 09/23/2018 Document Reviewed: 06/10/2016 Elsevier Patient Education  2020 [employer]  

## 2019-08-24 ENCOUNTER — Telehealth: Payer: Self-pay

## 2019-08-24 ENCOUNTER — Other Ambulatory Visit: Payer: Self-pay | Admitting: Family Medicine

## 2019-08-24 DIAGNOSIS — B3731 Acute candidiasis of vulva and vagina: Secondary | ICD-10-CM

## 2019-08-24 DIAGNOSIS — B373 Candidiasis of vulva and vagina: Secondary | ICD-10-CM

## 2019-08-24 LAB — OBSTETRIC PANEL, INCLUDING HIV
Antibody Screen: NEGATIVE
Basophils Absolute: 0 10*3/uL (ref 0.0–0.2)
Basos: 0 %
EOS (ABSOLUTE): 0.2 10*3/uL (ref 0.0–0.4)
Eos: 2 %
HIV Screen 4th Generation wRfx: NONREACTIVE
Hematocrit: 39.2 % (ref 34.0–46.6)
Hemoglobin: 13.4 g/dL (ref 11.1–15.9)
Hepatitis B Surface Ag: NEGATIVE
Immature Grans (Abs): 0 10*3/uL (ref 0.0–0.1)
Immature Granulocytes: 0 %
Lymphocytes Absolute: 2.9 10*3/uL (ref 0.7–3.1)
Lymphs: 29 %
MCH: 31.8 pg (ref 26.6–33.0)
MCHC: 34.2 g/dL (ref 31.5–35.7)
MCV: 93 fL (ref 79–97)
Monocytes Absolute: 0.7 10*3/uL (ref 0.1–0.9)
Monocytes: 7 %
Neutrophils Absolute: 6 10*3/uL (ref 1.4–7.0)
Neutrophils: 62 %
Platelets: 287 10*3/uL (ref 150–450)
RBC: 4.22 x10E6/uL (ref 3.77–5.28)
RDW: 13.3 % (ref 11.7–15.4)
RPR Ser Ql: NONREACTIVE
Rh Factor: POSITIVE
Rubella Antibodies, IGG: 5.8 index (ref >0.99)
WBC: 9.8 10*3/uL (ref 3.4–10.8)

## 2019-08-24 LAB — CERVICOVAGINAL ANCILLARY ONLY
Bacterial Vaginitis (gardnerella): NEGATIVE
Candida Glabrata: NEGATIVE
Candida Vaginitis: POSITIVE — AB
Chlamydia: NEGATIVE
Comment: NEGATIVE
Comment: NEGATIVE
Comment: NEGATIVE
Comment: NEGATIVE
Comment: NEGATIVE
Comment: NORMAL
Neisseria Gonorrhea: NEGATIVE
Trichomonas: NEGATIVE

## 2019-08-24 LAB — CYTOLOGY - PAP
Adequacy: ABSENT
Comment: NEGATIVE
Diagnosis: NEGATIVE
High risk HPV: NEGATIVE

## 2019-08-24 MED ORDER — TERCONAZOLE 0.4 % VA CREA: 1.0000 | TOPICAL_CREAM | Freq: Every day | VAGINAL | 0 refills | Status: AC

## 2019-08-24 NOTE — Progress Notes (Signed)
Terazol sent for vaginal yeast, please let patient know

## 2019-08-24 NOTE — Telephone Encounter (Signed)
Advised of results and rx sent 

## 2019-08-28 LAB — URINE CULTURE, OB REFLEX

## 2019-08-28 LAB — CULTURE, OB URINE

## 2019-08-29 ENCOUNTER — Other Ambulatory Visit: Payer: Self-pay | Admitting: Family Medicine

## 2019-08-29 ENCOUNTER — Ambulatory Visit (HOSPITAL_COMMUNITY): Payer: Medicaid Other | Attending: Family Medicine

## 2019-08-29 DIAGNOSIS — R8271 Bacteriuria: Secondary | ICD-10-CM

## 2019-08-29 MED ORDER — AMOXICILLIN 500 MG PO CAPS: 500.0000 mg | ORAL_CAPSULE | Freq: Three times a day (TID) | ORAL | 0 refills | Status: AC

## 2019-08-29 NOTE — Progress Notes (Signed)
Script sent for GBS bacteriuria  Leah Morris, Margarette Asal, DO OB Fellow, [facility] 08/29/2019 8:50 AM

## 2019-08-30 ENCOUNTER — Encounter: Payer: Self-pay | Admitting: Obstetrics and Gynecology

## 2019-09-02 ENCOUNTER — Encounter: Payer: Self-pay | Admitting: Obstetrics and Gynecology

## 2019-09-20 ENCOUNTER — Encounter: Payer: Medicaid Other | Admitting: Women's Health

## 2019-10-11 ENCOUNTER — Encounter: Payer: Medicaid Other | Admitting: Advanced Practice Midwife

## 2019-10-17 ENCOUNTER — Telehealth: Payer: Self-pay | Admitting: Advanced Practice Midwife

## 2019-10-20 ENCOUNTER — Ambulatory Visit: Payer: Medicaid Other

## 2019-11-30 ENCOUNTER — Encounter (HOSPITAL_COMMUNITY): Payer: Self-pay

## 2019-11-30 ENCOUNTER — Emergency Department (HOSPITAL_COMMUNITY): Payer: Medicaid Other

## 2019-11-30 ENCOUNTER — Other Ambulatory Visit: Payer: Self-pay

## 2019-11-30 ENCOUNTER — Emergency Department (HOSPITAL_COMMUNITY)
Admission: EM | Admit: 2019-11-30 | Discharge: 2019-11-30 | Disposition: A | Discharge status: Home / Self Care | Payer: Medicaid Other | Attending: Emergency Medicine | Admitting: Emergency Medicine

## 2019-11-30 DIAGNOSIS — Y9389 Activity, other specified: Secondary | ICD-10-CM | POA: Diagnosis not present

## 2019-11-30 DIAGNOSIS — Y998 Other external cause status: Secondary | ICD-10-CM | POA: Diagnosis not present

## 2019-11-30 DIAGNOSIS — S0181XA Laceration without foreign body of other part of head, initial encounter: Secondary | ICD-10-CM

## 2019-11-30 DIAGNOSIS — Y92009 Unspecified place in unspecified non-institutional (private) residence as the place of occurrence of the external cause: Secondary | ICD-10-CM | POA: Insufficient documentation

## 2019-11-30 DIAGNOSIS — S01111A Laceration without foreign body of right eyelid and periocular area, initial encounter: Secondary | ICD-10-CM | POA: Insufficient documentation

## 2019-11-30 DIAGNOSIS — Z23 Encounter for immunization: Secondary | ICD-10-CM | POA: Insufficient documentation

## 2019-11-30 DIAGNOSIS — S0993XA Unspecified injury of face, initial encounter: Secondary | ICD-10-CM | POA: Diagnosis present

## 2019-11-30 LAB — POC URINE PREG, ED: Preg Test, Ur: NEGATIVE

## 2019-11-30 MED ORDER — TETANUS-DIPHTH-ACELL PERTUSSIS 5-2.5-18.5 LF-MCG/0.5 IM SUSP
0.5000 mL | Freq: Once | INTRAMUSCULAR | Status: AC
  Administered 2019-11-30: 0.5 mL via INTRAMUSCULAR
  Filled 2019-11-30: qty 0.5

## 2019-11-30 MED ORDER — LIDOCAINE HCL 2 % IJ SOLN: 10.0000 mL | Freq: Once | INTRAMUSCULAR | Status: DC

## 2019-11-30 MED ORDER — ACETAMINOPHEN 325 MG PO TABS
650.0000 mg | ORAL_TABLET | Freq: Once | ORAL | Status: AC
  Administered 2019-11-30: 650 mg via ORAL
  Filled 2019-11-30: qty 2

## 2019-11-30 NOTE — ED Notes (Signed)
L eye 20/20 R eye 20/30
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
## 2019-12-16 ENCOUNTER — Ambulatory Visit: Payer: Medicaid Other
